# Patient Record
Sex: Female | Born: 1960 | Race: Black or African American | Hispanic: No | Smoking: Never smoker
Health system: Southern US, Community
[De-identification: ages and names within clinical notes are randomized; demographics above are authoritative.]

## PROBLEM LIST (undated history)

## (undated) DIAGNOSIS — M62838 Other muscle spasm: Secondary | ICD-10-CM

## (undated) DIAGNOSIS — I10 Essential (primary) hypertension: Secondary | ICD-10-CM

## (undated) HISTORY — PX: LEEP: SHX91

## (undated) HISTORY — DX: Other muscle spasm: M62.838

---

## 2014-02-05 ENCOUNTER — Encounter (HOSPITAL_COMMUNITY): Payer: Self-pay | Admitting: Emergency Medicine

## 2014-02-05 ENCOUNTER — Emergency Department (HOSPITAL_COMMUNITY): Payer: Medicaid Other

## 2014-02-05 ENCOUNTER — Emergency Department (HOSPITAL_COMMUNITY)
Admission: EM | Admit: 2014-02-05 | Discharge: 2014-02-05 | Disposition: A | Discharge status: Home / Self Care | Payer: Medicaid Other | Attending: Emergency Medicine | Admitting: Emergency Medicine

## 2014-02-05 DIAGNOSIS — R059 Cough, unspecified: Secondary | ICD-10-CM | POA: Insufficient documentation

## 2014-02-05 DIAGNOSIS — R05 Cough: Secondary | ICD-10-CM | POA: Diagnosis present

## 2014-02-05 DIAGNOSIS — J159 Unspecified bacterial pneumonia: Secondary | ICD-10-CM | POA: Insufficient documentation

## 2014-02-05 DIAGNOSIS — I10 Essential (primary) hypertension: Secondary | ICD-10-CM | POA: Insufficient documentation

## 2014-02-05 DIAGNOSIS — J189 Pneumonia, unspecified organism: Secondary | ICD-10-CM

## 2014-02-05 DIAGNOSIS — IMO0001 Reserved for inherently not codable concepts without codable children: Secondary | ICD-10-CM | POA: Insufficient documentation

## 2014-02-05 DIAGNOSIS — Z3202 Encounter for pregnancy test, result negative: Secondary | ICD-10-CM | POA: Diagnosis not present

## 2014-02-05 HISTORY — DX: Essential (primary) hypertension: I10

## 2014-02-05 LAB — CBC WITH DIFFERENTIAL/PLATELET
BASOS ABS: 0 10*3/uL (ref 0.0–0.1)
BASOS PCT: 0 % (ref 0–1)
Eosinophils Absolute: 0.1 10*3/uL (ref 0.0–0.7)
Eosinophils Relative: 1 % (ref 0–5)
HCT: 37.7 % (ref 36.0–46.0)
Hemoglobin: 12.9 g/dL (ref 12.0–15.0)
Lymphocytes Relative: 18 % (ref 12–46)
Lymphs Abs: 2.7 10*3/uL (ref 0.7–4.0)
MCH: 27.6 pg (ref 26.0–34.0)
MCHC: 34.2 g/dL (ref 30.0–36.0)
MCV: 80.6 fL (ref 78.0–100.0)
Monocytes Absolute: 1.5 10*3/uL — ABNORMAL HIGH (ref 0.1–1.0)
Monocytes Relative: 10 % (ref 3–12)
NEUTROS PCT: 71 % (ref 43–77)
Neutro Abs: 10.3 10*3/uL — ABNORMAL HIGH (ref 1.7–7.7)
Platelets: 309 10*3/uL (ref 150–400)
RBC: 4.68 MIL/uL (ref 3.87–5.11)
RDW: 13.7 % (ref 11.5–15.5)
WBC: 14.6 10*3/uL — ABNORMAL HIGH (ref 4.0–10.5)

## 2014-02-05 LAB — URINE MICROSCOPIC-ADD ON

## 2014-02-05 LAB — COMPREHENSIVE METABOLIC PANEL
ALK PHOS: 70 U/L (ref 39–117)
ALT: 17 U/L (ref 0–35)
AST: 23 U/L (ref 0–37)
Albumin: 3.2 g/dL — ABNORMAL LOW (ref 3.5–5.2)
Anion gap: 10 (ref 5–15)
BILIRUBIN TOTAL: 0.3 mg/dL (ref 0.3–1.2)
BUN: 8 mg/dL (ref 6–23)
CO2: 27 mEq/L (ref 19–32)
CREATININE: 1.13 mg/dL — AB (ref 0.50–1.10)
Calcium: 8.9 mg/dL (ref 8.4–10.5)
Chloride: 103 mEq/L (ref 96–112)
GFR calc Af Amer: 65 mL/min — ABNORMAL LOW (ref >90)
GFR calc non Af Amer: 56 mL/min — ABNORMAL LOW (ref >90)
Glucose, Bld: 118 mg/dL — ABNORMAL HIGH (ref 70–99)
POTASSIUM: 3.9 meq/L (ref 3.7–5.3)
Sodium: 140 mEq/L (ref 137–147)
Total Protein: 7.5 g/dL (ref 6.0–8.3)

## 2014-02-05 LAB — URINALYSIS, ROUTINE W REFLEX MICROSCOPIC
Bilirubin Urine: NEGATIVE
Glucose, UA: NEGATIVE mg/dL
Ketones, ur: NEGATIVE mg/dL
Leukocytes, UA: NEGATIVE
NITRITE: NEGATIVE
Protein, ur: NEGATIVE mg/dL
SPECIFIC GRAVITY, URINE: 1.016 (ref 1.005–1.030)
Urobilinogen, UA: 0.2 mg/dL (ref 0.0–1.0)
pH: 7 (ref 5.0–8.0)

## 2014-02-05 LAB — POC URINE PREG, ED: Preg Test, Ur: NEGATIVE

## 2014-02-05 LAB — I-STAT TROPONIN, ED: Troponin i, poc: 0 ng/mL (ref 0.00–0.08)

## 2014-02-05 LAB — TROPONIN I: Troponin I: <0.3 ng/mL (ref <0.30)

## 2014-02-05 LAB — RAPID STREP SCREEN (MED CTR MEBANE ONLY): Streptococcus, Group A Screen (Direct): NEGATIVE

## 2014-02-05 LAB — PRO B NATRIURETIC PEPTIDE: PRO B NATRI PEPTIDE: 42.5 pg/mL (ref 0–125)

## 2014-02-05 LAB — I-STAT CG4 LACTIC ACID, ED: LACTIC ACID, VENOUS: 1.04 mmol/L (ref 0.5–2.2)

## 2014-02-05 MED ORDER — SODIUM CHLORIDE 0.9 % IV BOLUS (SEPSIS)
1000.0000 mL | Freq: Once | INTRAVENOUS | Status: AC
  Administered 2014-02-05: 1000 mL via INTRAVENOUS

## 2014-02-05 MED ORDER — ACETAMINOPHEN 325 MG PO TABS
650.0000 mg | ORAL_TABLET | Freq: Once | ORAL | Status: AC
  Administered 2014-02-05: 650 mg via ORAL
  Filled 2014-02-05: qty 2

## 2014-02-05 MED ORDER — LEVOFLOXACIN 250 MG PO TABS: 750.0000 mg | ORAL_TABLET | Freq: Every day | ORAL | Status: DC

## 2014-02-05 MED ORDER — LEVOFLOXACIN 750 MG PO TABS
750.0000 mg | ORAL_TABLET | Freq: Once | ORAL | Status: AC
  Administered 2014-02-05: 750 mg via ORAL
  Filled 2014-02-05: qty 1

## 2014-02-05 NOTE — ED Notes (Signed)
Lab results given to PA. 

## 2014-02-05 NOTE — ED Notes (Signed)
Pt c/o sore throat with chills and fever onset mOnday, Pt now with productive cough.

## 2014-02-05 NOTE — ED Notes (Signed)
Pt ambulated with pulse ox. Sats did not drop below 96%.

## 2014-02-05 NOTE — Progress Notes (Signed)
  CARE MANAGEMENT ED NOTE 02/05/2014  Patient:  Kaylee Mendoza, Kaylee Mendoza   Account Number:  [id number]  Date Initiated:  02/05/2014  Documentation initiated by:  Livia Snellen  Subjective/Objective Assessment:   Patient presents to [facility] with cough, sore throat and flu like symptoms since Monday.     Subjective/Objective Assessment Detail:   Patient with pmhx of HTN.     Action/Plan:   Action/Plan Detail:   Anticipated DC Date:  02/05/2014     Status Recommendation to Physician:   Result of Recommendation:    Other ED [place]  Other  PCP issues    Choice offered to / List presented to:            Status of service:  Completed, signed off  ED Comments:   ED Comments Detail:  [facility] received phone call from patient regarding medication assistance.  Patient reports she was discharged from [facility] with prescription for Levaquin and is unable to afford it. Patient is without insurance or a pcp.  Patient has just moved here.  EDCM placed patient into the [facility] program. EDCM explained to patient that this is a one time assist and should esatablish care with a pcp.  EDCM advised patient that she will not be eligible for this program until Sept. 2016.  [facility] informed patient that the copay will be three dollars of which patient can afford.  Patient reports she is getting her RX filled at the [facility] on [address].  EDCM called pharmay at [phone] to receive pharmacy fax number of [phone].  [facility] faxed [place] letter to [place] Dezza @ 1734pm with confirmation of receipt at 1736pm.  [facility] also received patient's permission to email [facility] in attempts to make patient an appointment.  [facility] provided patient with phone number to [facility] and encouraged patient to call on Monday between 9am and 915am to schedule ED follow up appointment.  Patient verbalized understanding and thankful for assistance.  EDCM called [place] at 1832pm who confirms  patient's prescription has been filled for three dollars.  No further EDCM needs at this time.

## 2014-02-05 NOTE — ED Provider Notes (Signed)
CSN: [id number]     Arrival date & time 02/05/14  0915 History   First MD Initiated Contact with Patient 02/05/14 1012     Chief Complaint  Patient presents with  . Cough     (Consider location/radiation/quality/duration/timing/severity/associated sxs/prior Treatment) The history is provided by the patient. No language interpreter was used.  Kaylee Mendoza is a 53 y/o F with PMHx of HTN presenting to the ED with cough, sore throat, and flu-like symptoms that have been ongoing since Monday. Patient reported that on Monday she started to experience mild soreness to her throat, cough, and chest soreness associated with the cough. Reported that on Tuesday and Wednesday she started to feel feverish - reported that she has not been checking her temperature. Stated that last night she noticed that her cough has gotten worse - reported that she was unable to lay down flat secondary to continuous coughing. Reported that she has a productive cough of a clear sputum that consists of small specks of red. Stated that she had to get up from her bed and sleep on the couch - this was the only way that she could feel comfortable. Stated that she has been having increased chest soreness associated with coughing - reported that when she coughs she has some labored breathing. Reported generalized bodyaches in the beginning of the week that has now resolved. Stated that since the cough got worse so did her sore throat. Associated symptom of nasal congestion. Stated that she has been taking Alka seltzer plus and used apple cider vinegar last night to help calm down the coughing. Patient reported that her grandchildren has similar symptoms of fever, cough, nasal congestion. Denied nausea, vomiting, diarrhea, melena hematochezia, abdominal pain, neck pain, neck stiffness, chest pain, shortness of breath, travels, leg swelling, ear complain, eye complaints, blurred vision, sudden loss of vision. PCP none  Past Medical History   Diagnosis Date  . Hypertension    History reviewed. No pertinent past surgical history. No family history on file. History  Substance Use Topics  . Smoking status: Never Smoker   . Smokeless tobacco: Not on file  . Alcohol Use: Yes   OB History   Grav Para Term Preterm Abortions TAB SAB Ect Mult Living                 Review of Systems  Constitutional: Positive for fever and chills.  Respiratory: Positive for cough, chest tightness and shortness of breath.   Cardiovascular: Negative for chest pain.  Gastrointestinal: Negative for nausea, vomiting, abdominal pain, diarrhea, constipation, blood in stool and anal bleeding.  Musculoskeletal: Positive for myalgias (generalized). Negative for back pain, neck pain and neck stiffness.  Neurological: Negative for dizziness, weakness and headaches.      Allergies  Review of patient's allergies indicates no known allergies.  Home Medications   Prior to Admission medications   Medication Sig Start Date End Date Taking? Authorizing Provider  levofloxacin (LEVAQUIN) 250 MG tablet Take 3 tablets (750 mg total) by mouth daily. 02/05/14   Zeke Aker, PA-C   BP 139/86  Pulse 96  Temp(Src) 99.1 F (37.3 C) (Oral)  Resp 19  Ht 5\' 6"  (1.676 m)  Wt 215 lb (97.523 kg)  BMI 34.72 kg/m2  SpO2 95% Physical Exam  Nursing note and vitals reviewed. Constitutional: She is oriented to person, place, and time. She appears well-developed and well-nourished. No distress.  HENT:  Head: Normocephalic and atraumatic.  Mouth/Throat: Oropharynx is clear and moist. No oropharyngeal  exudate.  Nasal congestion noted on exam  Mild erythema noted to the posterior oropharynx. Uvula midline with symmetrical elevation. Negative deviation of the uvula noted - negative swelling. Negative exudate. Negative petechiae noted to the soft palate. Negative trismus. Mild tonsillar adenopathy noted bilaterally.  Eyes: Conjunctivae and EOM are normal. Pupils are  equal, round, and reactive to light. Right eye exhibits no discharge. Left eye exhibits no discharge.  Neck: Normal range of motion. Neck supple. No tracheal deviation present.  Negative neck stiffness Negative nuchal rigidity  Negative cervical lymphadenopathy  Negative meningeal signs   Cardiovascular: Normal rate, regular rhythm and normal heart sounds.  Exam reveals no friction rub.   No murmur heard. Cap refill < 3 seconds Negative swelling or pitting edema noted to the lower extremities bilaterally   Pulmonary/Chest: Effort normal and breath sounds normal. No respiratory distress. She has no wheezes. She has no rales. She exhibits tenderness.  Patient is able to speak in full sentences without difficulty  Negative use of accessory muscles Negative stridor  Musculoskeletal: Normal range of motion. She exhibits no edema and no tenderness.  Full ROM to upper and lower extremities without difficulty noted, negative ataxia noted.  Lymphadenopathy:       Head (right side): Tonsillar adenopathy present.       Head (left side): Tonsillar adenopathy present.    She has no cervical adenopathy.  Neurological: She is alert and oriented to person, place, and time. No cranial nerve deficit. She exhibits normal muscle tone. Coordination normal.  Cranial nerves III-XII grossly intact Strength 5+/5+ to upper and lower extremities bilaterally with resistance applied, equal distribution noted Equal grip strength bilaterally  Negative facial droop Negative slurred speech  Negative aphasia Negative arm drift Fine motor skills intact  Skin: Skin is warm and dry. No rash noted. She is not diaphoretic. No erythema.  Psychiatric: She has a normal mood and affect. Her behavior is normal. Thought content normal.    ED Course  Procedures (including critical care time)  Results for orders placed during the hospital encounter of 02/05/14  RAPID STREP SCREEN      Result Value Ref Range   Streptococcus,  Group A Screen (Direct) NEGATIVE  NEGATIVE  CBC WITH DIFFERENTIAL      Result Value Ref Range   WBC 14.6 (*) 4.0 - 10.5 K/uL   RBC 4.68  3.87 - 5.11 MIL/uL   Hemoglobin 12.9  12.0 - 15.0 g/dL   HCT 37.7  36.0 - 46.0 %   MCV 80.6  78.0 - 100.0 fL   MCH 27.6  26.0 - 34.0 pg   MCHC 34.2  30.0 - 36.0 g/dL   RDW 13.7  11.5 - 15.5 %   Platelets 309  150 - 400 K/uL   Neutrophils Relative % 71  43 - 77 %   Neutro Abs 10.3 (*) 1.7 - 7.7 K/uL   Lymphocytes Relative 18  12 - 46 %   Lymphs Abs 2.7  0.7 - 4.0 K/uL   Monocytes Relative 10  3 - 12 %   Monocytes Absolute 1.5 (*) 0.1 - 1.0 K/uL   Eosinophils Relative 1  0 - 5 %   Eosinophils Absolute 0.1  0.0 - 0.7 K/uL   Basophils Relative 0  0 - 1 %   Basophils Absolute 0.0  0.0 - 0.1 K/uL  COMPREHENSIVE METABOLIC PANEL      Result Value Ref Range   Sodium 140  137 - 147 mEq/L  Potassium 3.9  3.7 - 5.3 mEq/L   Chloride 103  96 - 112 mEq/L   CO2 27  19 - 32 mEq/L   Glucose, Bld 118 (*) 70 - 99 mg/dL   BUN 8  6 - 23 mg/dL   Creatinine, Ser 1.13 (*) 0.50 - 1.10 mg/dL   Calcium 8.9  8.4 - 10.5 mg/dL   Total Protein 7.5  6.0 - 8.3 g/dL   Albumin 3.2 (*) 3.5 - 5.2 g/dL   AST 23  0 - 37 U/L   ALT 17  0 - 35 U/L   Alkaline Phosphatase 70  39 - 117 U/L   Total Bilirubin 0.3  0.3 - 1.2 mg/dL   GFR calc non Af Amer 56 (*) >90 mL/min   GFR calc Af Amer 65 (*) >90 mL/min   Anion gap 10  5 - 15  TROPONIN I      Result Value Ref Range   Troponin I <0.30  <0.30 ng/mL  PRO B NATRIURETIC PEPTIDE      Result Value Ref Range   Pro B Natriuretic peptide (BNP) 42.5  0 - 125 pg/mL  URINALYSIS, ROUTINE W REFLEX MICROSCOPIC      Result Value Ref Range   Color, Urine YELLOW  YELLOW   APPearance CLEAR  CLEAR   Specific Gravity, Urine 1.016  1.005 - 1.030   pH 7.0  5.0 - 8.0   Glucose, UA NEGATIVE  NEGATIVE mg/dL   Hgb urine dipstick SMALL (*) NEGATIVE   Bilirubin Urine NEGATIVE  NEGATIVE   Ketones, ur NEGATIVE  NEGATIVE mg/dL   Protein, ur NEGATIVE   NEGATIVE mg/dL   Urobilinogen, UA 0.2  0.0 - 1.0 mg/dL   Nitrite NEGATIVE  NEGATIVE   Leukocytes, UA NEGATIVE  NEGATIVE  URINE MICROSCOPIC-ADD ON      Result Value Ref Range   Squamous Epithelial / LPF RARE  RARE   WBC, UA 0-2  <3 WBC/hpf   RBC / HPF 0-2  <3 RBC/hpf   Bacteria, UA RARE  RARE  I-STAT CG4 LACTIC ACID, ED      Result Value Ref Range   Lactic Acid, Venous 1.04  0.5 - 2.2 mmol/L  POC URINE PREG, ED      Result Value Ref Range   Preg Test, Ur NEGATIVE  NEGATIVE  I-STAT TROPOININ, ED      Result Value Ref Range   Troponin i, poc 0.00  0.00 - 0.08 ng/mL   Comment 3             Labs Review Labs Reviewed  CBC WITH DIFFERENTIAL - Abnormal; Notable for the following:    WBC 14.6 (*)    Neutro Abs 10.3 (*)    Monocytes Absolute 1.5 (*)    All other components within normal limits  COMPREHENSIVE METABOLIC PANEL - Abnormal; Notable for the following:    Glucose, Bld 118 (*)    Creatinine, Ser 1.13 (*)    Albumin 3.2 (*)    GFR calc non Af Amer 56 (*)    GFR calc Af Amer 65 (*)    All other components within normal limits  URINALYSIS, ROUTINE W REFLEX MICROSCOPIC - Abnormal; Notable for the following:    Hgb urine dipstick SMALL (*)    All other components within normal limits  RAPID STREP SCREEN  CULTURE, GROUP A STREP  TROPONIN I  PRO B NATRIURETIC PEPTIDE  URINE MICROSCOPIC-ADD ON  I-STAT CG4 LACTIC ACID, ED  POC URINE  PREG, ED  Randolm Idol, ED    Imaging Review Dg Chest 2 View  02/05/2014   CLINICAL DATA:  Cough and fever  EXAM: CHEST  2 VIEW  COMPARISON:  None.  FINDINGS: Left lower lobe airspace disease may represent atelectasis or pneumonia. Right lung clear. No effusion. Negative for heart failure.  IMPRESSION: Mild left lower lobe atelectasis/ pneumonia.   Electronically Signed   By: Franchot Gallo M.D.   On: 02/05/2014 11:38     EKG Interpretation None      MDM   Final diagnoses:  CAP (community acquired pneumonia)    Medications   acetaminophen (TYLENOL) tablet 650 mg (650 mg Oral Given 02/05/14 1000)  sodium chloride 0.9 % bolus 1,000 mL (0 mLs Intravenous Stopped 02/05/14 1212)  sodium chloride 0.9 % bolus 1,000 mL (0 mLs Intravenous Stopped 02/05/14 1628)  levofloxacin (LEVAQUIN) tablet 750 mg (750 mg Oral Given 02/05/14 1426)   Filed Vitals:   02/05/14 1500 02/05/14 1530 02/05/14 1600 02/05/14 1629  BP: 144/85 127/68 139/86   Pulse: 92 90 96   Temp:    99.1 F (37.3 C)  TempSrc:    Oral  Resp: 22 28 19    Height:      Weight:      SpO2: 96% 97% 95%    EKG noted normal sinus rhythm with a heart rate of 95 bpm, LVH noted. Troponin negative elevation. Second troponin negative elevation. BNP unremarkable. Rapid strep negative. CBC noted elevated white blood cell count of 14.6. CMP noted mildly elevated creatinine of 1.13. Lactic acid negative elevation. Urine pregnancy negative. Urinalysis noted small hemoglobin with negative nitrites or leukocytes identified-negative findings of infection. Chest x-ray noted mild left lower lobe pneumonia. Patient presenting to the ED with pneumonia noted on chest xray. Negative signs of respiratory distress. Patient given IV fluids and hydrated in the ED setting. Patient started on antibiotics for infection - CAP. Patient ambulated well with negative drop in pulse ox - denied shortness of breath or chest pain with ambulation. Based on PORT score patient is a class I and outpatient treatment is reasonable. Patient stable, afebrile. Patient not septic appearing. Patient feels comfortable to go home. Discharged patient. Discharged patient on antibiotics. Discussed with patient to rest and stay hydrated. Referred to [facility]. Discussed with patient to closely monitor fever. Discussed with patient to closely monitor symptoms and if symptoms are to worsen or change to report back to the ED - strict return instructions given.  Patient agreed to plan of care, understood, all  questions answered.   Jamse Mead, PA-C 02/05/14 1711

## 2014-02-05 NOTE — Discharge Instructions (Signed)
Please call your doctor for a followup appointment within 24-48 hours. When you talk to your doctor please let them know that you were seen in the emergency department and have them acquire all of your records so that they can discuss the findings with you and formulate a treatment plan to fully care for your new and ongoing problems. Please call and set-up an appointment with health and wellness center to be re-assessed Please rest and stay hydrated  Please get creatinine re-checked within 48 hours Please take antibiotics as prescribed and on a full stomach  Please continue to monitor symptoms closely and if symptoms are to worsen or change (fever greater than 101, chills, sweating, nausea, vomiting, chest pain, shortness of breathe, difficulty breathing, weakness, numbness, tingling, worsening or changes to pain pattern, worsening or change to cough, fainting, neck pain, neck stiffness) please report back to the Emergency Department immediately.   Pneumonia Pneumonia is an infection of the lungs.  CAUSES Pneumonia may be caused by bacteria or a virus. Usually, these infections are caused by breathing infectious particles into the lungs (respiratory tract). SIGNS AND SYMPTOMS   Cough.  Fever.  Chest pain.  Increased rate of breathing.  Wheezing.  Mucus production. DIAGNOSIS  If you have the common symptoms of pneumonia, your health care provider will typically confirm the diagnosis with a chest X-ray. The X-ray will show an abnormality in the lung (pulmonary infiltrate) if you have pneumonia. Other tests of your blood, urine, or sputum may be done to find the specific cause of your pneumonia. Your health care provider may also do tests (blood gases or pulse oximetry) to see how well your lungs are working. TREATMENT  Some forms of pneumonia may be spread to other people when you cough or sneeze. You may be asked to wear a mask before and during your exam. Pneumonia that is caused by  bacteria is treated with antibiotic medicine. Pneumonia that is caused by the influenza virus may be treated with an antiviral medicine. Most other viral infections must run their course. These infections will not respond to antibiotics.  HOME CARE INSTRUCTIONS   Cough suppressants may be used if you are losing too much rest. However, coughing protects you by clearing your lungs. You should avoid using cough suppressants if you can.  Your health care provider may have prescribed medicine if he or she thinks your pneumonia is caused by bacteria or influenza. Finish your medicine even if you start to feel better.  Your health care provider may also prescribe an expectorant. This loosens the mucus to be coughed up.  Take medicines only as directed by your health care provider.  Do not smoke. Smoking is a common cause of bronchitis and can contribute to pneumonia. If you are a smoker and continue to smoke, your cough may last several weeks after your pneumonia has cleared.  A cold steam vaporizer or humidifier in your room or home may help loosen mucus.  Coughing is often worse at night. Sleeping in a semi-upright position in a recliner or using a couple pillows under your head will help with this.  Get rest as you feel it is needed. Your body will usually let you know when you need to rest. PREVENTION A pneumococcal shot (vaccine) is available to prevent a common bacterial cause of pneumonia. This is usually suggested for:  People over 40 years old.  Patients on chemotherapy.  People with chronic lung problems, such as bronchitis or emphysema.  People with  immune system problems. If you are over 65 or have a high risk condition, you may receive the pneumococcal vaccine if you have not received it before. In some countries, a routine influenza vaccine is also recommended. This vaccine can help prevent some cases of pneumonia.You may be offered the influenza vaccine as part of your care. If  you smoke, it is time to quit. You may receive instructions on how to stop smoking. Your health care provider can provide medicines and counseling to help you quit. SEEK MEDICAL CARE IF: You have a fever. SEEK IMMEDIATE MEDICAL CARE IF:   Your illness becomes worse. This is especially true if you are elderly or weakened from any other disease.  You cannot control your cough with suppressants and are losing sleep.  You begin coughing up blood.  You develop pain which is getting worse or is uncontrolled with medicines.  Any of the symptoms which initially brought you in for treatment are getting worse rather than better.  You develop shortness of breath or chest pain. MAKE SURE YOU:   Understand these instructions.  Will watch your condition.  Will get help right away if you are not doing well or get worse. Document Released: 05/07/2005 Document Revised: 09/21/2013 Document Reviewed: 07/27/2010 [facility] Patient Information 2015 [place], [place]. This information is not intended to replace advice given to you by your health care provider. Make sure you discuss any questions you have with your health care provider.   Emergency Department Resource Guide 1) Find a Doctor and Pay Out of Pocket Although you won't have to find out who is covered by your insurance plan, it is a good idea to ask around and get recommendations. You will then need to call the office and see if the doctor you have chosen will accept you as a new patient and what types of options they offer for patients who are self-pay. Some doctors offer discounts or will set up payment plans for their patients who do not have insurance, but you will need to ask so you aren't surprised when you get to your appointment.  2) Contact Your Local Health Department Not all health departments have doctors that can see patients for sick visits, but many do, so it is worth a call to see if yours does. If you don't know where your local  health department is, you can check in your phone book. The CDC also has a tool to help you locate your state's health department, and many state websites also have listings of all of their local health departments.  3) Find a [facility] If your illness is not likely to be very severe or complicated, you may want to try a walk in clinic. These are popping up all over the country in pharmacies, drugstores, and shopping centers. They're usually staffed by nurse practitioners or physician assistants that have been trained to treat common illnesses and complaints. They're usually fairly quick and inexpensive. However, if you have serious medical issues or chronic medical problems, these are probably not your best option.  No Primary Care Doctor: - Call Health Connect at  [phone] - they can help you locate a primary care doctor that  accepts your insurance, provides certain services, etc. - Physician Referral Service- [phone]  Chronic Pain Problems: Organization         Address  Phone   Notes  [facility]  [phone] Patients need to be referred by their primary care doctor.  Medication Assistance: Organization         Address  Phone   Notes  [facility] Medication [facility] [address] [place], [place] [address] [phone] --Must be a resident of [facility] -- Must have NO insurance coverage whatsoever (no Medicaid/ Medicare, etc.) -- The pt. MUST have a primary care doctor that directs their care regularly and follows them in the community   [facility]ist  [phone]   [employer]  [phone]    Agencies that provide inexpensive medical care: Organization         Address  Phone   Notes  [place]  [phone]   [facility]    [phone]   [facility] [place], [place] [address] [phone]   [place] [address], [place] [phone]   Planned Parenthood    [phone]   [facility]    [phone]   [place] and [place] [address], [place] Phone:  [phone], Fax:  [phone] Hours of Operation:  9 am - 6 pm, M-F.  Also accepts Medicaid/Medicare and self-pay.  [facility] for [place], [address], [place] Phone: [phone], Fax: [phone]. Hours of Operation:  8:30 am - 5:30 pm, M-F.  Also accepts Medicaid and self-pay.  [facility] [address] [place] Phone: [phone]   [place], [place], [place] [phone], Ext. [phone] Mondays & Thursdays: 7-9 AM.  First 15 patients are seen on a first come, first serve basis.    [place] Providers:  Organization         Address  Phone   Notes  [facility] [address], [place] [phone] Also accepts self-pay patients.  [facility] [address], [place]  [phone]   [place], [address], [place] [phone]   [facility] [address], [place] [phone]   Lucianne Lei [address], [place]   [phone] Only accepts [place] Access [place] patients after they have their name applied to their card.   Self-Pay (no insurance) in [facility]:  Organization         Address  Phone   Notes  Sickle Cell Patients, [facility] [place] [phone]   [facility] [place] [phone]   [facility] [place]  [place], [place], [place] [phone]   [facility]/Dr. Osei-Bonsu  [address], [place] or [address], [place] [phone] Phone number for both [place] and  [place] locations is the same.  Urgent Medical and [facility] [address], [place] [phone]   [facility] [address] [place] or [address] [phone]   [facility] [address] [place] [phone], phone; [phone], fax Sees patients 1st and 3rd Saturday of every month.  Must not qualify for public or private insurance (i.e. Medicaid, Medicare, [place] Health Choice, Veterans' Benefits)  Household income should be no more than 200% of the poverty level The  clinic cannot treat you if you are pregnant or think you are pregnant  Sexually transmitted diseases are not treated at the clinic.    Dental Care: Organization         Address  Phone  Notes  [facility] Department of [facility] [phone] Accepts children up to age 41 who are enrolled in [place] or [place]; pregnant women with a Medicaid card; and children who have applied for Medicaid or [place] Health Choice, but were declined, whose parents can pay a reduced fee at time of service.  [facility] Department of [facility]  [address], [place] [phone] Accepts children up to age 37 who are enrolled in [place] or [place]; pregnant women with a Medicaid card; and children who have applied for Medicaid or  Health Choice, but were declined, whose parents can pay a reduced fee at time of service.  [place] Adult Dental Access PROGRAM  [place] [phone] Patients are seen by appointment only. Walk-ins are not accepted. [place] will see patients 32 years of age and older. Monday - Tuesday (8am-5pm) Most Wednesdays (8:30-5pm) $30 per visit, cash only  [facility] Adult Dental Access PROGRAM  [address], [facility] [phone] Patients are seen by appointment only. Walk-ins are not accepted.  [place] will see patients 17 years of age and older. One Wednesday Evening (Monthly: Volunteer Based).  $30 per visit, cash only  [place]  [phone] for adults; Children under age 30, call Graduate Pediatric Dentistry at [phone]. Children aged 49-14, please call [phone] to request a pediatric application.  Dental services are provided in all areas of dental care including fillings, crowns and bridges, complete and partial dentures, implants, gum treatment, root canals, and extractions. Preventive care is also provided. Treatment is provided to both adults and children. Patients are selected via a lottery and there is often a waiting list.   [facility] [address] [place]  [phone] [email or web address]   [facility] [address] [place], [place] [phone], Ext. [phone] Second and Fourth Thursday of each month, opens at 6:30 AM; Clinic ends at 9 AM.  Patients are seen on a first-come first-served basis, and a limited number are seen during each clinic.   [facility]  [address] Hillard Danker [place], [place] [phone]   Eligibility Requirements You must have lived in [place], [place], or [place] counties for at least the last three months.   You cannot be eligible for state or federal sponsored [employer], including [employer], [place], or [employer].   You generally cannot be eligible for healthcare insurance through your employer.    How to apply: Eligibility screenings are held every Tuesday and Wednesday afternoon from 1:00 pm until 4:00 pm. You do not need an appointment for the interview!  [facility] [address], [place], [place]   [place]  [place] Department  [place]  [phone]    Behavioral Health Resources in the  Community: Intensive Outpatient Programs Organization         Address  Phone  Notes  [place]. [address] [place], [place] [phone]   [facility] [address] [place] [employer],  [place] [phone]   ADS: [facility] Drug Svcs [address] [place], [place]   [place] [address],  [place], [place] or [phone]   Substance Abuse Resources Organization         Address  Phone  Notes  Alcohol and Drug Services  3015454660   [place]  [phone]   [facility] [place]   Chinita Pester  9406975812   Residential & Outpatient Substance Abuse Program  [phone]   Psychological Services Organization         Address  Phone  Notes  [facility] [place]  [place]  [phone]   [place] [address] [place] or [phone]    Mobile Crisis Teams Organization         Address  Phone  Notes  [facility], Mobile Crisis Care Unit  [phone]   [facility]  [address] [place], [place]   [place] [address], [place] [phone]    Self-Help/Support Groups Organization         Address  Phone             Notes  [place]. of [place] - variety of support groups  [place] Call for more information  Narcotics Anonymous (NA), [facility] [address], [employer] [place]  2 meetings at this location   Special educational needs teacher         Address  Phone  Notes  [facility] Residential Treatment [place],    [place]  [phone]   [facility]  [address] [place] [address], [place], [place]   [place], [place] [phone] Admissions: 8am-3pm M-F  Incentives Substance [place] [address],    [place], [place] [phone]   [facility] [address] [place], [place], [place]   [facility] [address],  [place], [place]   [facility] - Intensive Outpatient [address], [place], [place]   [facility].,  [place], [place] [phone] or [phone]   [facility] Treatment Services (RTS) [address], [place], [place] Accepts Medicaid  [facility] [place] [address]  [place] [phone] Substance Abuse/Addiction Treatment   [facility] Organization         Address  Phone  Notes  [facility]  [phone]   Domenic Schwab, PhD [address] Arlis Porta [place], [place]   [phone] or [phone]   [place], [place] [phone]   [facility] [address] [place], [place] [phone] Insurance/Medicaid/sponsorship through [facility] [address], [address] [place]                                    [place], [place] [phone] [place] [address], [place] [phone]    Dr. Adele Schilder  [phone]   [facility] Dept. 1) [address], [place] 2) [place] 3)  [address], [place] [phone]  (  [place] [phone] or [phone] (After Hours)

## 2014-02-06 NOTE — ED Provider Notes (Signed)
Medical screening examination/treatment/procedure(s) were performed by non-physician practitioner and as supervising physician I was immediately available for consultation/collaboration.   EKG Interpretation None        Carmin Muskrat, MD 02/06/14 [phone]

## 2014-02-07 LAB — CULTURE, GROUP A STREP

## 2015-05-12 ENCOUNTER — Other Ambulatory Visit: Payer: Self-pay

## 2015-05-12 DIAGNOSIS — Z1231 Encounter for screening mammogram for malignant neoplasm of breast: Secondary | ICD-10-CM

## 2015-05-30 ENCOUNTER — Ambulatory Visit: Payer: Medicaid Other

## 2015-08-08 ENCOUNTER — Ambulatory Visit
Admission: RE | Admit: 2015-08-08 | Discharge: 2015-08-08 | Disposition: A | Discharge status: Home / Self Care | Payer: Medicaid Other | Source: Ambulatory Visit

## 2015-08-08 DIAGNOSIS — Z1231 Encounter for screening mammogram for malignant neoplasm of breast: Secondary | ICD-10-CM

## 2015-08-18 ENCOUNTER — Other Ambulatory Visit: Payer: Self-pay | Admitting: Nurse Practitioner

## 2015-08-18 DIAGNOSIS — R928 Other abnormal and inconclusive findings on diagnostic imaging of breast: Secondary | ICD-10-CM

## 2015-08-29 ENCOUNTER — Ambulatory Visit
Admission: RE | Admit: 2015-08-29 | Discharge: 2015-08-29 | Disposition: A | Discharge status: Home / Self Care | Payer: Medicaid Other | Source: Ambulatory Visit | Attending: *Deleted | Admitting: *Deleted

## 2015-08-29 DIAGNOSIS — R928 Other abnormal and inconclusive findings on diagnostic imaging of breast: Secondary | ICD-10-CM

## 2016-09-26 ENCOUNTER — Other Ambulatory Visit: Payer: Self-pay | Admitting: Nurse Practitioner

## 2016-09-26 DIAGNOSIS — Z1231 Encounter for screening mammogram for malignant neoplasm of breast: Secondary | ICD-10-CM

## 2016-10-10 ENCOUNTER — Ambulatory Visit
Admission: RE | Admit: 2016-10-10 | Discharge: 2016-10-10 | Disposition: A | Discharge status: Home / Self Care | Payer: Medicaid Other | Source: Ambulatory Visit | Attending: Nurse Practitioner | Admitting: Nurse Practitioner

## 2016-10-10 DIAGNOSIS — Z1231 Encounter for screening mammogram for malignant neoplasm of breast: Secondary | ICD-10-CM

## 2018-02-11 ENCOUNTER — Emergency Department (HOSPITAL_COMMUNITY)
Admission: EM | Admit: 2018-02-11 | Discharge: 2018-02-12 | Disposition: A | Discharge status: Home / Self Care | Payer: Self-pay | Attending: Emergency Medicine | Admitting: Emergency Medicine

## 2018-02-11 ENCOUNTER — Other Ambulatory Visit: Payer: Self-pay

## 2018-02-11 ENCOUNTER — Encounter (HOSPITAL_COMMUNITY): Payer: Self-pay | Admitting: Emergency Medicine

## 2018-02-11 DIAGNOSIS — Y939 Activity, unspecified: Secondary | ICD-10-CM | POA: Insufficient documentation

## 2018-02-11 DIAGNOSIS — I10 Essential (primary) hypertension: Secondary | ICD-10-CM | POA: Insufficient documentation

## 2018-02-11 DIAGNOSIS — S29012A Strain of muscle and tendon of back wall of thorax, initial encounter: Secondary | ICD-10-CM | POA: Insufficient documentation

## 2018-02-11 DIAGNOSIS — Y33XXXA Other specified events, undetermined intent, initial encounter: Secondary | ICD-10-CM | POA: Insufficient documentation

## 2018-02-11 DIAGNOSIS — Y998 Other external cause status: Secondary | ICD-10-CM | POA: Insufficient documentation

## 2018-02-11 DIAGNOSIS — T148XXA Other injury of unspecified body region, initial encounter: Secondary | ICD-10-CM

## 2018-02-11 DIAGNOSIS — Y929 Unspecified place or not applicable: Secondary | ICD-10-CM | POA: Insufficient documentation

## 2018-02-11 DIAGNOSIS — S299XXA Unspecified injury of thorax, initial encounter: Secondary | ICD-10-CM | POA: Insufficient documentation

## 2018-02-11 NOTE — ED Triage Notes (Addendum)
Pt c/o chronic back pain and hypertension. Hx of HTN, not taking medications. Denies chest pain/shortness of breath/headache.

## 2018-02-12 NOTE — ED Provider Notes (Signed)
[facility] EMERGENCY DEPARTMENT Provider Note  CSN: [id number] Arrival date & time: 02/11/18 2120  Chief Complaint(s) Hypertension  HPI Kaylee Mendoza is a 57 y.o. female with a history of chronic back pain resulting in elevated blood pressures when pain is severe not currently on any BP medication who presents to the emergency department with elevated systolic blood pressures in the 200s in the setting of exacerbation of her back pain.  Patient denied any associated chest pain or shortness of breath but they report palpitations and "thumping" of her heart which were the reason she presented.  Her palpitations and thumping has resolved.  Patient has not taken any medicine for blood pressure.  She denies any recent fevers or infections.  Her back pain is achy/crampy nature and exacerbated with palpation and movement.  She denies any coughing.  No nausea or vomiting.  No abdominal pain.  No focal deficits.  HPI  Past Medical History Past Medical History:  Diagnosis Date  . Hypertension    There are no active problems to display for this patient.  Home Medication(s) Prior to Admission medications   Medication Sig Start Date End Date Taking? Authorizing Provider  levofloxacin (LEVAQUIN) 250 MG tablet Take 3 tablets (750 mg total) by mouth daily. Patient not taking: Reported on 02/12/2018 02/05/14   Jamse Mead, PA-C                                                                                                                                    Past Surgical History History reviewed. No pertinent surgical history. Family History No family history on file.  Social History Social History   Tobacco Use  . Smoking status: Never Smoker  . Smokeless tobacco: Never Used  Substance Use Topics  . Alcohol use: Yes  . Drug use: No   Allergies Patient has no known allergies.  Review of Systems Review of Systems All other systems are reviewed and are negative for  acute change except as noted in the HPI  Physical Exam Vital Signs  I have reviewed the triage vital signs BP (!) 141/68   Pulse (!) 57   Temp 97.6 F (36.4 C)   Resp 16   SpO2 93%   Physical Exam  Constitutional: She is oriented to person, place, and time. She appears well-developed and well-nourished. No distress.  HENT:  Head: Normocephalic and atraumatic.  Nose: Nose normal.  Eyes: Pupils are equal, round, and reactive to light. Conjunctivae and EOM are normal. Right eye exhibits no discharge. Left eye exhibits no discharge. No scleral icterus.  Neck: Normal range of motion. Neck supple.  Cardiovascular: Normal rate and regular rhythm. Exam reveals no gallop and no friction rub.  No murmur heard. Pulmonary/Chest: Effort normal and breath sounds normal. No stridor. No respiratory distress. She has no rales.  Abdominal: Soft. She exhibits no distension. There is no tenderness.  Musculoskeletal: She exhibits no  edema.       Cervical back: She exhibits tenderness and spasm.       Back:  Neurological: She is alert and oriented to person, place, and time.  Skin: Skin is warm and dry. No rash noted. She is not diaphoretic. No erythema.  Psychiatric: She has a normal mood and affect.  Vitals reviewed.   ED Results and Treatments Labs (all labs ordered are listed, but only abnormal results are displayed) Labs Reviewed - No data to display                                                                                                                       EKG  EKG Interpretation  Date/Time:  Wednesday February 12 2018 02:13:36 EDT Ventricular Rate:  74 PR Interval:    QRS Duration: 117 QT Interval:  411 QTC Calculation: 456 R Axis:   22 Text Interpretation:  Sinus rhythm Incomplete left bundle branch block No significant change since last tracing Confirmed by Addison Lank [phone]) on 02/12/2018 2:24:22 AM      Radiology No results found. Pertinent labs & imaging  results that were available during my care of the patient were reviewed by me and considered in my medical decision making (see chart for details).  Medications Ordered in ED Medications - No data to display                                                                                                                                  Procedures Procedures  (including critical care time)  Medical Decision Making / ED Course I have reviewed the nursing notes for this encounter and the patient's prior records (if available in EHR or on provided paperwork).    Elevated blood pressures in the setting of back pain.  Pressures have now resolved spontaneously.  EKG without acute ischemic changes.  Doubt ACS.  Not consistent with pulmonary embolism.  Not classic for your dissection or esophageal perforation.  Otherwise asymptomatic hypertension which is now resolved.  The patient appears reasonably screened and/or stabilized for discharge and I doubt any other medical condition or other [facility] requiring further screening, evaluation, or treatment in the ED at this time prior to discharge.  The patient is safe for discharge with strict return precautions.   Final Clinical Impression(s) / ED Diagnoses Final diagnoses:  Hypertension, unspecified type  Muscle strain    Disposition: Discharge  Condition: Good  I have discussed the results, Dx and Tx plan with the patient who expressed understanding and agree(s) with the plan. Discharge instructions discussed at great length. The patient was given strict return precautions who verbalized understanding of the instructions. No further questions at time of discharge.    ED Discharge Orders    None       Follow Up: [place], [address] [place] [address] [phone]  Schedule an appointment as soon as possible for a visit  As needed    This chart was dictated using voice recognition software.  Despite best  efforts to proofread,  errors can occur which can change the documentation meaning.   Fatima Blank, MD 02/12/18 [phone]

## 2018-02-12 NOTE — ED Notes (Signed)
ED Provider at bedside. 

## 2018-02-12 NOTE — Discharge Instructions (Addendum)
You may use over-the-counter Motrin (Ibuprofen), Acetaminophen (Tylenol), topical muscle creams such as SalonPas, Icy Hot, Bengay, etc. Please stretch, apply heat, and have massage therapy for additional assistance. ° °

## 2018-02-12 NOTE — ED Notes (Signed)
Patient Alert and oriented to baseline. Stable and ambulatory to baseline. Patient verbalized understanding of the discharge instructions.  Patient belongings were taken by the patient.   

## 2018-06-30 ENCOUNTER — Encounter: Payer: Self-pay | Admitting: Nurse Practitioner

## 2018-06-30 ENCOUNTER — Ambulatory Visit: Payer: BC Managed Care – PPO | Admitting: Nurse Practitioner

## 2018-06-30 VITALS — BP 150/100 | HR 77 | Temp 97.7°F | Ht 66.25 in | Wt 215.6 lb

## 2018-06-30 DIAGNOSIS — Z87891 Personal history of nicotine dependence: Secondary | ICD-10-CM

## 2018-06-30 DIAGNOSIS — R0602 Shortness of breath: Secondary | ICD-10-CM

## 2018-06-30 DIAGNOSIS — G8929 Other chronic pain: Secondary | ICD-10-CM | POA: Diagnosis not present

## 2018-06-30 DIAGNOSIS — R030 Elevated blood-pressure reading, without diagnosis of hypertension: Secondary | ICD-10-CM | POA: Diagnosis not present

## 2018-06-30 DIAGNOSIS — R7303 Prediabetes: Secondary | ICD-10-CM

## 2018-06-30 DIAGNOSIS — Y9269 Other specified industrial and construction area as the place of occurrence of the external cause: Secondary | ICD-10-CM

## 2018-06-30 DIAGNOSIS — M545 Low back pain, unspecified: Secondary | ICD-10-CM

## 2018-06-30 DIAGNOSIS — Z832 Family history of diseases of the blood and blood-forming organs and certain disorders involving the immune mechanism: Secondary | ICD-10-CM

## 2018-06-30 DIAGNOSIS — M62838 Other muscle spasm: Secondary | ICD-10-CM | POA: Diagnosis not present

## 2018-06-30 DIAGNOSIS — W1809XA Striking against other object with subsequent fall, initial encounter: Secondary | ICD-10-CM

## 2018-06-30 MED ORDER — CYCLOBENZAPRINE HCL 10 MG PO TABS
10.0000 mg | ORAL_TABLET | Freq: Three times a day (TID) | ORAL | 0 refills | Status: DC | PRN
Start: 2018-06-30 — End: 2018-10-01

## 2018-06-30 NOTE — Progress Notes (Addendum)
Subjective:     Patient ID: Kaylee Mendoza , female    DOB: Nov 26, 1960 , 58 y.o.   MRN: [id number]   Chief Complaint  Patient presents with  . Abnormal Glucose  . Back Pain    HPI  She had a fall last week at work - planning to go to [employer].    Back Pain  This is a chronic problem. The current episode started more than 1 year ago. The pain is present in the lumbar spine and thoracic spine. The quality of the pain is described as aching and burning. The pain does not radiate. Associated symptoms include leg pain. Pertinent negatives include no chest pain, fever, numbness or tingling. She has tried nothing for the symptoms.  Leg Pain   The incident occurred more than 1 week ago (after having fall at work - [employer].  Tripped over a stand that was in the door.  ). The injury mechanism was a fall. The pain has been intermittent since onset. Pertinent negatives include no inability to bear weight, numbness or tingling.  Shortness of Breath  This is a chronic problem. The current episode started more than 1 year ago. The problem occurs intermittently. Associated symptoms include leg pain. Pertinent negatives include no chest pain, fever or sore throat. The patient has no known risk factors for DVT/PE. She has tried nothing for the symptoms. There is no history of allergies, CAD, COPD or pneumonia. (Quit smoking 17 years ago.)     Past Medical History:  Diagnosis Date  . Hypertension      No family history on file.  No current outpatient medications on file.   No Known Allergies   Review of Systems  Constitutional: Negative for fever.  HENT: Negative for sore throat.   Respiratory: Positive for shortness of breath. Negative for cough.   Cardiovascular: Negative for chest pain.  Musculoskeletal: Positive for back pain.  Neurological: Negative for tingling and numbness.     Today's Vitals   06/30/18 1616  BP: (!) 150/100  Pulse: 77  Temp: 97.7 F (36.5 C)   TempSrc: Oral  SpO2: 96%  Weight: 215 lb 9.6 oz (97.8 kg)  Height: 5' 6.25" (1.683 m)  PainSc: 0-No pain   Body mass index is 34.54 kg/m.   Objective:  Physical Exam Constitutional:      Appearance: Normal appearance. She is obese.  Cardiovascular:     Rate and Rhythm: Normal rate and regular rhythm.     Pulses: Normal pulses.     Heart sounds: Normal heart sounds. No murmur.  Pulmonary:     Effort: Pulmonary effort is normal. No respiratory distress.     Breath sounds: Normal breath sounds. No wheezing, rhonchi or rales.  Chest:     Chest wall: No tenderness.  Musculoskeletal:     Right lower leg: No edema.     Left lower leg: No edema.     Comments: Negative straight leg raise bilaterally  Mild tenderness to low back flank area primarily on right side  Skin:    General: Skin is dry.     Capillary Refill: Capillary refill takes less than 2 seconds.  Neurological:     General: No focal deficit present.     Mental Status: She is alert.         Assessment And Plan:     1. Chronic bilateral low back pain without sciatica  Negative straight leg raise  Will treat with toradol and kenalog  injection - ketorolac (TORADOL) 30 MG/ML injection 30 mg - triamcinolone acetonide (KENALOG-40) injection 60 mg  2. Prediabetes  Chronic, controlled  No current medications  Encouraged to limit intake of sugary foods and drinks  Encouraged to increase physical activity to 150 minutes per week - Hemoglobin A1c  3. Family history of alpha thalassemia  Reports her daughter was recently diagnosed and she is concerned about if she has the same problem  Discussed with her would check baseline labs for autoimmune however this is a blood dyscrasia which may need further treatment by a hematologist - Autoimmune Profile - CRP (C-Reactive Protein)  4. Blood pressure elevated without history of HTN . B/P is elevated today, she is not taking any medications and feels this is  related to her pain.  . CMP ordered to check renal function.  . The importance of regular exercise and dietary modification was stressed to the patient.  . Stressed importance of losing ten percent of her body weight to help with B/P control.  . The weight loss would help with decreasing cardiac and cancer risk as well.  - CBC no Diff - BMP8+eGFR  5. Muscle spasm  Tension noted to low back area  I have stressed the importance of her stretching regularly - cyclobenzaprine (FLEXERIL) 10 MG tablet; Take 1 tablet (10 mg total) by mouth 3 (three) times daily as needed for muscle spasms.  Dispense: 30 tablet; Refill: 0 - ketorolac (TORADOL) 30 MG/ML injection 30 mg - triamcinolone acetonide (KENALOG-40) injection 60 mg    6. Shortness of breath  No abnormal findings on physical exam.    No current shortness of breath this is intermittent  May be related to anxiety vs deconditioning.  Janece Moore, FNP  

## 2018-07-01 LAB — BMP8+EGFR
BUN / CREAT RATIO: 12 (ref 9–23)
BUN: 14 mg/dL (ref 6–24)
CHLORIDE: 103 mmol/L (ref 96–106)
CO2: 24 mmol/L (ref 20–29)
CREATININE: 1.13 mg/dL — AB (ref 0.57–1.00)
Calcium: 9.9 mg/dL (ref 8.7–10.2)
GFR calc Af Amer: 63 mL/min/{1.73_m2} (ref >59)
GFR calc non Af Amer: 55 mL/min/{1.73_m2} — ABNORMAL LOW (ref >59)
GLUCOSE: 104 mg/dL — AB (ref 65–99)
Potassium: 4.3 mmol/L (ref 3.5–5.2)
SODIUM: 141 mmol/L (ref 134–144)

## 2018-07-01 LAB — HEMOGLOBIN A1C
ESTIMATED AVERAGE GLUCOSE: 134 mg/dL
HEMOGLOBIN A1C: 6.3 % — AB (ref 4.8–5.6)

## 2018-07-01 LAB — CBC
HEMOGLOBIN: 13 g/dL (ref 11.1–15.9)
Hematocrit: 39.5 % (ref 34.0–46.6)
MCH: 26.9 pg (ref 26.6–33.0)
MCHC: 32.9 g/dL (ref 31.5–35.7)
MCV: 82 fL (ref 79–97)
PLATELETS: 363 10*3/uL (ref 150–450)
RBC: 4.84 x10E6/uL (ref 3.77–5.28)
RDW: 13.7 % (ref 11.7–15.4)
WBC: 10.1 10*3/uL (ref 3.4–10.8)

## 2018-07-01 LAB — C-REACTIVE PROTEIN: CRP: 3 mg/L (ref 0–10)

## 2018-07-01 LAB — AUTOIMMUNE PROFILE
Anti Nuclear Antibody(ANA): NEGATIVE
COMPLEMENT C3, SERUM: 137 mg/dL (ref 82–167)

## 2018-07-02 DIAGNOSIS — M545 Low back pain: Secondary | ICD-10-CM | POA: Diagnosis not present

## 2018-07-02 DIAGNOSIS — M62838 Other muscle spasm: Secondary | ICD-10-CM | POA: Diagnosis not present

## 2018-07-02 DIAGNOSIS — G8929 Other chronic pain: Secondary | ICD-10-CM | POA: Diagnosis not present

## 2018-07-02 MED ORDER — KETOROLAC TROMETHAMINE 30 MG/ML IJ SOLN
30.0000 mg | Freq: Once | INTRAMUSCULAR | Status: AC
  Administered 2018-07-02: 30 mg via INTRAMUSCULAR

## 2018-07-02 MED ORDER — TRIAMCINOLONE ACETONIDE 40 MG/ML IJ SUSP
60.0000 mg | Freq: Once | INTRAMUSCULAR | Status: AC
  Administered 2018-07-02: 60 mg via INTRAMUSCULAR

## 2018-07-07 ENCOUNTER — Ambulatory Visit: Payer: BC Managed Care – PPO | Admitting: Nurse Practitioner

## 2018-07-07 ENCOUNTER — Other Ambulatory Visit: Payer: Self-pay

## 2018-07-07 ENCOUNTER — Encounter: Payer: Self-pay | Admitting: Nurse Practitioner

## 2018-07-07 VITALS — BP 136/78 | HR 73 | Temp 97.3°F | Ht 66.2 in | Wt 215.6 lb

## 2018-07-07 DIAGNOSIS — R7303 Prediabetes: Secondary | ICD-10-CM

## 2018-07-07 DIAGNOSIS — E6609 Other obesity due to excess calories: Secondary | ICD-10-CM

## 2018-07-07 DIAGNOSIS — R030 Elevated blood-pressure reading, without diagnosis of hypertension: Secondary | ICD-10-CM

## 2018-07-07 DIAGNOSIS — M62838 Other muscle spasm: Secondary | ICD-10-CM

## 2018-07-07 DIAGNOSIS — Z6834 Body mass index (BMI) 34.0-34.9, adult: Secondary | ICD-10-CM

## 2018-07-07 NOTE — Progress Notes (Signed)
  Subjective:     Patient ID: Kaylee Mendoza , female    DOB: [birth year] , 58 y.o.   MRN: [id number]   Chief Complaint  Patient presents with  . Hypertension    patient presents today for a blood pressure recheck.    HPI  HPI   Past Medical History:  Diagnosis Date  . Hypertension      Family History  Problem Relation Age of Onset  . Diabetes Mother   . Heart disease Mother   . Diabetes Father      Current Outpatient Medications:  .  cyclobenzaprine (FLEXERIL) 10 MG tablet, Take 1 tablet (10 mg total) by mouth 3 (three) times daily as needed for muscle spasms., Disp: 30 tablet, Rfl: 0   No Known Allergies   Review of Systems  Constitutional: Negative.   Respiratory: Negative for shortness of breath.   Cardiovascular: Negative.  Negative for chest pain, palpitations and leg swelling.  Endocrine: Negative for polydipsia, polyphagia and polyuria.  Musculoskeletal: Positive for back pain. Negative for myalgias.  Skin: Negative.   Neurological: Negative for dizziness and headaches.     Today's Vitals   07/07/18 1625  BP: 136/78  Pulse: 73  Temp: (!) 97.3 F (36.3 C)  TempSrc: Oral  SpO2: 96%  Weight: 215 lb 9.6 oz (97.8 kg)  Height: 5' 6.2" (1.681 m)   Body mass index is 34.59 kg/m.   Objective:  Physical Exam Constitutional:      Appearance: Normal appearance.  Cardiovascular:     Rate and Rhythm: Normal rate and regular rhythm.     Pulses: Normal pulses.     Heart sounds: Normal heart sounds. No murmur.  Pulmonary:     Effort: No respiratory distress.     Breath sounds: Normal breath sounds.  Neurological:     General: No focal deficit present.     Mental Status: She is alert and oriented to person, place, and time.  Psychiatric:        Mood and Affect: Mood normal.        Behavior: Behavior normal.        Thought Content: Thought content normal.        Judgment: Judgment normal.         Assessment And Plan:    1. Blood pressure elevated  without history of HTN . B/P is better controlled.  . The importance of regular exercise and dietary modification was stressed to the patient.  . Stressed importance of losing ten percent of her body weight to help with B/P control.  . The weight loss would help with decreasing cardiac and cancer risk as well.   2. Prediabetes  Chronic, fair control  Continue with current medications  Encouraged to limit intake of sugary foods and drinks  Encouraged to increase physical activity to 150 minutes per week  3. Muscle spasm  She reports her back pain is better  4. Class 1 obesity due to excess calories without serious comorbidity with body mass index (BMI) of 34.0 to 34.9 in adult  Chronic  Discussed healthy diet and regular exercise options   Encouraged to exercise at least 150 minutes per week with 2 days of strength training    Minette Brine, FNP

## 2018-07-07 NOTE — Patient Instructions (Signed)
Glucosamine tablets and capsules What is this medicine? GLUCOSAMINE (gloo KOH suh meen) is a dietary supplement. It is promoted to keep joints healthy and working smoothly. The FDA has not approved this supplement for any medical use. This supplement may be used for other purposes; ask your health care provider or pharmacist if you have questions. This medicine may be used for other purposes; ask your health care provider or pharmacist if you have questions. COMMON BRAND NAME(S): Genicin, OptiFlex-G What should I tell my health care provider before I take this medicine? They need to know if you have any of these conditions: -diabetes -kidney disease -liver disease -stomach or intestinal problems -an unusual or allergic reaction to glucosamine, other herbs, plants, medicines, foods, dyes, or preservatives -pregnant or trying to get pregnant -breast-feeding How should I use this medicine? Take this medicine by mouth with a glass of water. Follow the directions on the package labeling, or take as directed by your health care professional. Take your doses at regular intervals. If this medicine upsets your stomach, take it with food. Do not take this medicine more often than directed. Contact your pediatrician regarding the use of this medicine in children. Special care may be needed. Overdosage: If you think you have taken too much of this medicine contact a poison control center or emergency room at once. NOTE: This medicine is only for you. Do not share this medicine with others. What if I miss a dose? If you miss a dose, take it as soon as you can. If it is almost time for your next dose, take only that dose. Do not take double or extra doses. What may interact with this medicine? -warfarin This list may not describe all possible interactions. Give your health care provider a list of all the medicines, herbs, non-prescription drugs, or dietary supplements you use. Also tell them if you smoke,  drink alcohol, or use illegal drugs. Some items may interact with your medicine. What should I watch for while using this medicine? See your doctor if your symptoms do not get better or if they get worse. If you are scheduled for any medical or dental procedure, tell your healthcare provider that you are taking this medicine. You may need to stop taking this medicine before the procedure. Herbal or dietary supplements are not regulated like medicines. Rigid quality control standards are not required for dietary supplements. The purity and strength of these products can vary. The safety and effect of this dietary supplement for a certain disease or illness is not well known. This product is not intended to diagnose, treat, cure or prevent any disease. The Food and Drug Administration suggests the following to help consumers protect themselves: -Always read product labels and follow directions. -Natural does not mean a product is safe for humans to take. -Look for products that include USP after the ingredient name. This means that the manufacturer followed the standards of the [place] Pharmacopoeia. -Supplements made or sold by a nationally known food or drug company are more likely to be made under tight controls. You can write to the company for more information about how the product was made. What side effects may I notice from receiving this medicine? Side effects that you should report to your doctor or health care professional as soon as possible: -allergic reactions like skin rash, itching or hives, swelling of the face, lips, or tongue -breathing problems -constipation or diarrhea -loss of appetite -stomach pain Side effects that usually do not require  medical attention (report to your doctor or health care professional if they continue or are bothersome): -gas -headache -nausea -stomach upset This list may not describe all possible side effects. Call your doctor for medical advice about side  effects. You may report side effects to FDA at 1-800-FDA-1088. Where should I keep my medicine? Keep out of the reach of children. Store at room temperature or as directed on the package label. Protect from moisture. Throw away any unused medicine after the expiration date. NOTE: This sheet is a summary. It may not cover all possible information. If you have questions about this medicine, talk to your doctor, pharmacist, or health care provider.  2019 Elsevier/Gold Standard (2007-09-17 10:56:07)

## 2018-07-13 ENCOUNTER — Encounter: Payer: Self-pay | Admitting: Nurse Practitioner

## 2018-07-15 ENCOUNTER — Encounter: Payer: Self-pay | Admitting: Nurse Practitioner

## 2018-07-29 DIAGNOSIS — R0602 Shortness of breath: Secondary | ICD-10-CM | POA: Insufficient documentation

## 2018-07-29 DIAGNOSIS — M62838 Other muscle spasm: Secondary | ICD-10-CM | POA: Insufficient documentation

## 2018-07-29 DIAGNOSIS — M545 Low back pain: Principal | ICD-10-CM

## 2018-07-29 DIAGNOSIS — G8929 Other chronic pain: Secondary | ICD-10-CM | POA: Insufficient documentation

## 2018-10-01 ENCOUNTER — Other Ambulatory Visit: Payer: Self-pay

## 2018-10-01 ENCOUNTER — Encounter: Payer: BC Managed Care – PPO | Admitting: Nurse Practitioner

## 2018-10-01 ENCOUNTER — Ambulatory Visit: Payer: BC Managed Care – PPO | Admitting: Nurse Practitioner

## 2018-10-01 ENCOUNTER — Encounter: Payer: Self-pay | Admitting: Nurse Practitioner

## 2018-10-01 VITALS — BP 128/86 | HR 85 | Temp 97.9°F | Ht 65.6 in | Wt 216.2 lb

## 2018-10-01 DIAGNOSIS — E669 Obesity, unspecified: Secondary | ICD-10-CM | POA: Insufficient documentation

## 2018-10-01 DIAGNOSIS — Z1231 Encounter for screening mammogram for malignant neoplasm of breast: Secondary | ICD-10-CM

## 2018-10-01 DIAGNOSIS — Z1211 Encounter for screening for malignant neoplasm of colon: Secondary | ICD-10-CM | POA: Diagnosis not present

## 2018-10-01 DIAGNOSIS — R7303 Prediabetes: Secondary | ICD-10-CM

## 2018-10-01 DIAGNOSIS — Z1159 Encounter for screening for other viral diseases: Secondary | ICD-10-CM

## 2018-10-01 DIAGNOSIS — M62838 Other muscle spasm: Secondary | ICD-10-CM

## 2018-10-01 DIAGNOSIS — Z0000 Encounter for general adult medical examination without abnormal findings: Secondary | ICD-10-CM

## 2018-10-01 DIAGNOSIS — I10 Essential (primary) hypertension: Secondary | ICD-10-CM | POA: Insufficient documentation

## 2018-10-01 LAB — POCT URINALYSIS DIPSTICK
Glucose, UA: NEGATIVE
Ketones, UA: NEGATIVE
Nitrite, UA: NEGATIVE
Protein, UA: POSITIVE — AB
Spec Grav, UA: 1.025 (ref 1.010–1.025)
Urobilinogen, UA: 0.2 E.U./dL
pH, UA: 6 (ref 5.0–8.0)

## 2018-10-01 MED ORDER — CYCLOBENZAPRINE HCL 10 MG PO TABS: 10.0000 mg | ORAL_TABLET | Freq: Three times a day (TID) | ORAL | 0 refills | Status: DC | PRN

## 2018-10-01 NOTE — Progress Notes (Signed)
Subjective:     Patient ID: Kaylee Mendoza , female    DOB: Oct 09, 1960 , 58 y.o.   MRN: [id number]   Chief Complaint  Patient presents with  . Annual Exam   The patient states she uses post menopausal status for birth control. Last LMP was No LMP recorded. (Menstrual status: Perimenopausal).. Negative for Dysmenorrhea and Negative for Menorrhagia Mammogram last done 10/11/2016. Negative for: breast discharge, breast lump(s), breast pain and breast self exam.  Pertinent negatives include abnormal bleeding (hematology), anxiety, decreased libido, depression, difficulty falling sleep, dyspareunia, history of infertility, nocturia, sexual dysfunction, sleep disturbances, urinary incontinence, urinary urgency, vaginal discharge and vaginal itching. Diet regular.The patient states her exercise level is  minimal amount since COVID-19.     The patient's tobacco use is:  Social History   Tobacco Use  Smoking Status Never Smoker  Smokeless Tobacco Never Used  . She has been exposed to passive smoke. The patient's alcohol use is:  Social History   Substance and Sexual Activity  Alcohol Use Yes   Additional information: Last pap 06/10/2017, next one scheduled for 2022.  HPI  She is working from home  Here for [facility]    Past Medical History:  Diagnosis Date  . Hypertension      Family History  Problem Relation Age of Onset  . Diabetes Mother   . Heart disease Mother   . Diabetes Father      Current Outpatient Medications:  .  cyclobenzaprine (FLEXERIL) 10 MG tablet, Take 1 tablet (10 mg total) by mouth 3 (three) times daily as needed for muscle spasms., Disp: 30 tablet, Rfl: 0   No Known Allergies   Review of Systems  Constitutional: Negative.   HENT: Negative.   Eyes: Negative.   Respiratory: Negative.  Negative for cough.   Cardiovascular: Negative.  Negative for chest pain, palpitations and leg swelling.  Gastrointestinal: Negative.   Endocrine: Negative.   Genitourinary:  Negative.   Musculoskeletal: Negative.   Skin: Negative.   Allergic/Immunologic: Negative.   Neurological: Negative.   Hematological: Negative.   Psychiatric/Behavioral: Negative.      Today's Vitals   10/01/18 1124  BP: 128/86  Pulse: 85  Temp: 97.9 F (36.6 C)  TempSrc: Oral  Weight: 216 lb 3.2 oz (98.1 kg)  Height: 5' 5.6" (1.666 m)  PainSc: 0-No pain   Body mass index is 35.32 kg/m.   Objective:  Physical Exam Vitals signs reviewed.  Constitutional:      Appearance: Normal appearance. She is well-developed.  HENT:     Head: Normocephalic and atraumatic.     Right Ear: Hearing, tympanic membrane, ear canal and external ear normal. There is no impacted cerumen.     Left Ear: Hearing, tympanic membrane, ear canal and external ear normal. There is no impacted cerumen.     Nose: Nose normal.     Mouth/Throat:     Mouth: Mucous membranes are moist.  Eyes:     General: Lids are normal.     Conjunctiva/sclera: Conjunctivae normal.     Pupils: Pupils are equal, round, and reactive to light.     Funduscopic exam:    Right eye: No papilledema.        Left eye: No papilledema.  Neck:     Musculoskeletal: Full passive range of motion without pain, normal range of motion and neck supple.     Thyroid: No thyroid mass.     Vascular: No carotid bruit.  Cardiovascular:  Rate and Rhythm: Normal rate and regular rhythm.     Pulses: Normal pulses.     Heart sounds: Normal heart sounds. No murmur.  Pulmonary:     Effort: Pulmonary effort is normal.     Breath sounds: Normal breath sounds.  Abdominal:     General: Abdomen is flat. Bowel sounds are normal.     Palpations: Abdomen is soft.  Musculoskeletal: Normal range of motion.        General: No swelling.     Right lower leg: No edema.     Left lower leg: No edema.  Skin:    General: Skin is warm and dry.     Capillary Refill: Capillary refill takes less than 2 seconds.  Neurological:     General: No focal deficit  present.     Mental Status: She is alert and oriented to person, place, and time.     Cranial Nerves: No cranial nerve deficit.     Sensory: No sensory deficit.  Psychiatric:        Mood and Affect: Mood normal.        Behavior: Behavior normal.        Thought Content: Thought content normal.        Judgment: Judgment normal.         Assessment And Plan:     1. Encounter for general adult medical examination w/o abnormal findings . Behavior modifications discussed and diet history reviewed.   . Pt will continue to exercise regularly and modify diet with low GI, plant based foods and decrease intake of processed foods.  . Recommend intake of daily multivitamin, Vitamin D, and calcium.  . Recommend mammogram and colonoscopy for preventive screenings, as well as recommend immunizations that include influenza, TDAP, and Shingles - POCT Urinalysis Dipstick (81002)  2. Encounter for screening mammogram for breast cancer  Pt instructed on Self Breast Exam.According to ACOG guidelines Women aged 23 and older are recommended to get an annual mammogram. Form completed and given to patient contact the [facility] for appointment scheduing.   Pt encouraged to get annual mammogram - MM Digital Screening; Future  3. Encounter for screening colonoscopy  According to USPTF Colorectal cancer Screening guidelines. Colonoscopy is recommended every 10 years, starting at age 58years.  Will refer to GI for colon cancer screening. - Ambulatory referral to Gastroenterology  4. Obesity (BMI 35.0-39.9 without comorbidity)  Chronic  Discussed healthy diet and regular exercise options   Encouraged to exercise at least 150 minutes per week with 2 days of strength training  5. Essential hypertension B/P is much better controlled.  CMP ordered to check renal function.  The importance of regular exercise and dietary modification was stressed to the patient.  Stressed importance of losing ten  percent of her body weight to help with B/P control.       Minette Brine, FNP    THE PATIENT IS ENCOURAGED TO PRACTICE SOCIAL DISTANCING DUE TO THE COVID-19 PANDEMIC.

## 2018-10-01 NOTE — Patient Instructions (Signed)
Health Maintenance  Topic Date Due  . Hepatitis C Screening  Mar 02, 1961  . HIV Screening  03/23/1978  . TETANUS/TDAP  03/23/1982  . COLONOSCOPY  03/23/2013  . MAMMOGRAM  10/11/2018  . INFLUENZA VACCINE  12/20/2018  . PAP SMEAR-Modifier  06/10/2020   Health Maintenance, Female Adopting a healthy lifestyle and getting preventive care can go a long way to promote health and wellness. Talk with your health care provider about what schedule of regular examinations is right for you. This is a good chance for you to check in with your provider about disease prevention and staying healthy. In between checkups, there are plenty of things you can do on your own. Experts have done a lot of research about which lifestyle changes and preventive measures are most likely to keep you healthy. Ask your health care provider for more information. Weight and diet Eat a healthy diet  Be sure to include plenty of vegetables, fruits, low-fat dairy products, and lean protein.  Do not eat a lot of foods high in solid fats, added sugars, or salt.  Get regular exercise. This is one of the most important things you can do for your health. ? Most adults should exercise for at least 150 minutes each week. The exercise should increase your heart rate and make you sweat (moderate-intensity exercise). ? Most adults should also do strengthening exercises at least twice a week. This is in addition to the moderate-intensity exercise. Maintain a healthy weight  Body mass index (BMI) is a measurement that can be used to identify possible weight problems. It estimates body fat based on height and weight. Your health care provider can help determine your BMI and help you achieve or maintain a healthy weight.  For females 24 years of age and older: ? A BMI below 18.5 is considered underweight. ? A BMI of 18.5 to 24.9 is normal. ? A BMI of 25 to 29.9 is considered overweight. ? A BMI of 30 and above is considered obese. Watch  levels of cholesterol and blood lipids  You should start having your blood tested for lipids and cholesterol at 56 years of age, then have this test every 5 years.  You may need to have your cholesterol levels checked more often if: ? Your lipid or cholesterol levels are high. ? You are older than 56 years of age. ? You are at high risk for heart disease. Cancer screening Lung Cancer  Lung cancer screening is recommended for adults 38-18 years old who are at high risk for lung cancer because of a history of smoking.  A yearly low-dose CT scan of the lungs is recommended for people who: ? Currently smoke. ? Have quit within the past 15 years. ? Have at least a 30-pack-year history of smoking. A pack year is smoking an average of one pack of cigarettes a day for 1 year.  Yearly screening should continue until it has been 15 years since you quit.  Yearly screening should stop if you develop a health problem that would prevent you from having lung cancer treatment. Breast Cancer  Practice breast self-awareness. This means understanding how your breasts normally appear and feel.  It also means doing regular breast self-exams. Let your health care provider know about any changes, no matter how small.  If you are in your 20s or 30s, you should have a clinical breast exam (CBE) by a health care provider every 1-3 years as part of a regular health exam.  If you  are 79 or older, have a CBE every year. Also consider having a breast X-ray (mammogram) every year.  If you have a family history of breast cancer, talk to your health care provider about genetic screening.  If you are at high risk for breast cancer, talk to your health care provider about having an MRI and a mammogram every year.  Breast cancer gene (BRCA) assessment is recommended for women who have family members with BRCA-related cancers. BRCA-related cancers include: ? Breast. ? Ovarian. ? Tubal. ? Peritoneal  cancers.  Results of the assessment will determine the need for genetic counseling and BRCA1 and BRCA2 testing. Cervical Cancer Your health care provider may recommend that you be screened regularly for cancer of the pelvic organs (ovaries, uterus, and vagina). This screening involves a pelvic examination, including checking for microscopic changes to the surface of your cervix (Pap test). You may be encouraged to have this screening done every 3 years, beginning at age 97.  For women ages 59-65, health care providers may recommend pelvic exams and Pap testing every 3 years, or they may recommend the Pap and pelvic exam, combined with testing for human papilloma virus (HPV), every 5 years. Some types of HPV increase your risk of cervical cancer. Testing for HPV may also be done on women of any age with unclear Pap test results.  Other health care providers may not recommend any screening for nonpregnant women who are considered low risk for pelvic cancer and who do not have symptoms. Ask your health care provider if a screening pelvic exam is right for you.  If you have had past treatment for cervical cancer or a condition that could lead to cancer, you need Pap tests and screening for cancer for at least 20 years after your treatment. If Pap tests have been discontinued, your risk factors (such as having a new sexual partner) need to be reassessed to determine if screening should resume. Some women have medical problems that increase the chance of getting cervical cancer. In these cases, your health care provider may recommend more frequent screening and Pap tests. Colorectal Cancer  This type of cancer can be detected and often prevented.  Routine colorectal cancer screening usually begins at 56 years of age and continues through 56 years of age.  Your health care provider may recommend screening at an earlier age if you have risk factors for colon cancer.  Your health care provider may also  recommend using home test kits to check for hidden blood in the stool.  A small camera at the end of a tube can be used to examine your colon directly (sigmoidoscopy or colonoscopy). This is done to check for the earliest forms of colorectal cancer.  Routine screening usually begins at age 37.  Direct examination of the colon should be repeated every 5-10 years through 56 years of age. However, you may need to be screened more often if early forms of precancerous polyps or small growths are found. Skin Cancer  Check your skin from head to toe regularly.  Tell your health care provider about any new moles or changes in moles, especially if there is a change in a mole's shape or color.  Also tell your health care provider if you have a mole that is larger than the size of a pencil eraser.  Always use sunscreen. Apply sunscreen liberally and repeatedly throughout the day.  Protect yourself by wearing long sleeves, pants, a wide-brimmed hat, and sunglasses whenever you are outside.  Heart disease, diabetes, and high blood pressure  High blood pressure causes heart disease and increases the risk of stroke. High blood pressure is more likely to develop in: ? People who have blood pressure in the high end of the normal range (130-139/85-89 mm Hg). ? People who are overweight or obese. ? People who are African American.  If you are 53-43 years of age, have your blood pressure checked every 3-5 years. If you are 62 years of age or older, have your blood pressure checked every year. You should have your blood pressure measured twice-once when you are at a hospital or clinic, and once when you are not at a hospital or clinic. Record the average of the two measurements. To check your blood pressure when you are not at a hospital or clinic, you can use: ? An automated blood pressure machine at a pharmacy. ? A home blood pressure monitor.  If you are between 38 years and 33 years old, ask your health  care provider if you should take aspirin to prevent strokes.  Have regular diabetes screenings. This involves taking a blood sample to check your fasting blood sugar level. ? If you are at a normal weight and have a low risk for diabetes, have this test once every three years after 56 years of age. ? If you are overweight and have a high risk for diabetes, consider being tested at a younger age or more often. Preventing infection Hepatitis B  If you have a higher risk for hepatitis B, you should be screened for this virus. You are considered at high risk for hepatitis B if: ? You were born in a country where hepatitis B is common. Ask your health care provider which countries are considered high risk. ? Your parents were born in a high-risk country, and you have not been immunized against hepatitis B (hepatitis B vaccine). ? You have HIV or AIDS. ? You use needles to inject street drugs. ? You live with someone who has hepatitis B. ? You have had sex with someone who has hepatitis B. ? You get hemodialysis treatment. ? You take certain medicines for conditions, including cancer, organ transplantation, and autoimmune conditions. Hepatitis C  Blood testing is recommended for: ? Everyone born from [birth year] through 1963. ? Anyone with known risk factors for hepatitis C. Sexually transmitted infections (STIs)  You should be screened for sexually transmitted infections (STIs) including gonorrhea and chlamydia if: ? You are sexually active and are younger than 56 years of age. ? You are older than 57 years of age and your health care provider tells you that you are at risk for this type of infection. ? Your sexual activity has changed since you were last screened and you are at an increased risk for chlamydia or gonorrhea. Ask your health care provider if you are at risk.  If you do not have HIV, but are at risk, it may be recommended that you take a prescription medicine daily to prevent HIV  infection. This is called pre-exposure prophylaxis (PrEP). You are considered at risk if: ? You are sexually active and do not regularly use condoms or know the HIV status of your partner(s). ? You take drugs by injection. ? You are sexually active with a partner who has HIV. Talk with your health care provider about whether you are at high risk of being infected with HIV. If you choose to begin PrEP, you should first be tested for HIV. You should then  be tested every 3 months for as long as you are taking PrEP. Pregnancy  If you are premenopausal and you may become pregnant, ask your health care provider about preconception counseling.  If you may become pregnant, take 400 to 800 micrograms (mcg) of folic acid every day.  If you want to prevent pregnancy, talk to your health care provider about birth control (contraception). Osteoporosis and menopause  Osteoporosis is a disease in which the bones lose minerals and strength with aging. This can result in serious bone fractures. Your risk for osteoporosis can be identified using a bone density scan.  If you are 10 years of age or older, or if you are at risk for osteoporosis and fractures, ask your health care provider if you should be screened.  Ask your health care provider whether you should take a calcium or vitamin D supplement to lower your risk for osteoporosis.  Menopause may have certain physical symptoms and risks.  Hormone replacement therapy may reduce some of these symptoms and risks. Talk to your health care provider about whether hormone replacement therapy is right for you. Follow these instructions at home:  Schedule regular health, dental, and eye exams.  Stay current with your immunizations.  Do not use any tobacco products including cigarettes, chewing tobacco, or electronic cigarettes.  If you are pregnant, do not drink alcohol.  If you are breastfeeding, limit how much and how often you drink alcohol.  Limit  alcohol intake to no more than 1 drink per day for nonpregnant women. One drink equals 12 ounces of beer, 5 ounces of wine, or 1 ounces of hard liquor.  Do not use street drugs.  Do not share needles.  Ask your health care provider for help if you need support or information about quitting drugs.  Tell your health care provider if you often feel depressed.  Tell your health care provider if you have ever been abused or do not feel safe at home. This information is not intended to replace advice given to you by your health care provider. Make sure you discuss any questions you have with your health care provider. Document Released: 11/20/2010 Document Revised: 10/13/2015 Document Reviewed: 02/08/2015 Elsevier Interactive Patient Education  2019 [employer].

## 2018-10-02 LAB — CMP14 + ANION GAP
ALT: 20 IU/L (ref 0–32)
AST: 18 IU/L (ref 0–40)
Albumin/Globulin Ratio: 1.3 (ref 1.2–2.2)
Albumin: 4.4 g/dL (ref 3.8–4.9)
Alkaline Phosphatase: 71 IU/L (ref 39–117)
Anion Gap: 15 mmol/L (ref 10.0–18.0)
BUN/Creatinine Ratio: 13 (ref 9–23)
BUN: 16 mg/dL (ref 6–24)
Bilirubin Total: 0.2 mg/dL (ref 0.0–1.2)
CO2: 25 mmol/L (ref 20–29)
Calcium: 9.9 mg/dL (ref 8.7–10.2)
Chloride: 101 mmol/L (ref 96–106)
Creatinine, Ser: 1.2 mg/dL — ABNORMAL HIGH (ref 0.57–1.00)
GFR calc Af Amer: 59 mL/min/{1.73_m2} — ABNORMAL LOW (ref >59)
GFR calc non Af Amer: 51 mL/min/{1.73_m2} — ABNORMAL LOW (ref >59)
Globulin, Total: 3.5 g/dL (ref 1.5–4.5)
Glucose: 86 mg/dL (ref 65–99)
Potassium: 4.5 mmol/L (ref 3.5–5.2)
Sodium: 141 mmol/L (ref 134–144)
Total Protein: 7.9 g/dL (ref 6.0–8.5)

## 2018-10-02 LAB — HEPATITIS C ANTIBODY: Hep C Virus Ab: <0.1 s/co ratio (ref 0.0–0.9)

## 2018-10-02 LAB — CBC
Hematocrit: 41.2 % (ref 34.0–46.6)
Hemoglobin: 13.5 g/dL (ref 11.1–15.9)
MCH: 26.9 pg (ref 26.6–33.0)
MCHC: 32.8 g/dL (ref 31.5–35.7)
MCV: 82 fL (ref 79–97)
Platelets: 381 10*3/uL (ref 150–450)
RBC: 5.02 x10E6/uL (ref 3.77–5.28)
RDW: 14.1 % (ref 11.7–15.4)
WBC: 10.2 10*3/uL (ref 3.4–10.8)

## 2018-10-02 LAB — HEMOGLOBIN A1C
Est. average glucose Bld gHb Est-mCnc: 137 mg/dL
Hgb A1c MFr Bld: 6.4 % — ABNORMAL HIGH (ref 4.8–5.6)

## 2018-10-09 ENCOUNTER — Other Ambulatory Visit: Payer: Self-pay

## 2018-10-09 ENCOUNTER — Telehealth: Payer: Self-pay

## 2018-10-09 MED ORDER — VITAMIN D (ERGOCALCIFEROL) 1.25 MG (50000 UNIT) PO CAPS: 50000.0000 [IU] | ORAL_CAPSULE | ORAL | 0 refills | Status: DC

## 2018-10-09 NOTE — Telephone Encounter (Signed)
I called pt and notified her of her vitamin d level 18.2 and also let her know we sent a Rx for Vitamin D for her to take for 3 months and we will recheck it when she comes in August. [facility]

## 2018-10-09 NOTE — Progress Notes (Signed)
V

## 2018-10-28 ENCOUNTER — Other Ambulatory Visit: Payer: Self-pay | Admitting: Nurse Practitioner

## 2018-10-28 LAB — SPECIMEN STATUS REPORT

## 2018-10-28 LAB — VITAMIN D 25 HYDROXY (VIT D DEFICIENCY, FRACTURES): Vit D, 25-Hydroxy: 18.2 ng/mL — ABNORMAL LOW (ref 30.0–100.0)

## 2018-10-28 MED ORDER — VITAMIN D (ERGOCALCIFEROL) 1.25 MG (50000 UNIT) PO CAPS: 50000.0000 [IU] | ORAL_CAPSULE | ORAL | 0 refills | Status: DC

## 2018-10-28 NOTE — Progress Notes (Signed)
Vitamin d is low at 18.2 I will send a Rx for vitamin d 50,000 units twice a week for 12 weeks, we will recheck

## 2018-11-03 ENCOUNTER — Encounter: Payer: Self-pay | Admitting: Internal Medicine

## 2018-11-14 ENCOUNTER — Ambulatory Visit: Payer: Self-pay | Admitting: *Deleted

## 2018-11-14 ENCOUNTER — Other Ambulatory Visit: Payer: Self-pay

## 2018-11-14 VITALS — Ht 66.0 in | Wt 210.0 lb

## 2018-11-14 DIAGNOSIS — Z800 Family history of malignant neoplasm of digestive organs: Secondary | ICD-10-CM

## 2018-11-14 DIAGNOSIS — Z1211 Encounter for screening for malignant neoplasm of colon: Secondary | ICD-10-CM

## 2018-11-14 MED ORDER — NA SULFATE-K SULFATE-MG SULF 17.5-3.13-1.6 GM/177ML PO SOLN: ORAL | 0 refills | Status: DC

## 2018-11-14 NOTE — Progress Notes (Signed)
Patient's pre-visit was done today over the phone with the patient due to COVID-19 pandemic. Name,DOB and address verified. Insurance verified. Packet of Prep instructions mailed to patient including copy of a consent form and pre-procedure patient acknowledgement form-pt is aware. Suprep $15 Coupon included. Patient understands to call us back with any questions or concerns. Patient denies any allergies to eggs or soy. Patient denies any problems with anesthesia/sedation. Patient denies any oxygen use at home. Patient denies taking any diet/weight loss medications or blood thinners. EMMI education assisgned to patient on colonoscopy, this was explained and instructions given to patient. Pt is aware that care partner will wait in the car during parking lot; if they feel like they will be too hot to wait in the car; they may wait in the lobby.  We want them to wear a mask (we do not have any that we can provide them), practice social distancing, and we will check their temperatures when they get here.  I did remind patient that their care partner needs to stay in the parking lot the entire time. Pt will wear mask into building

## 2018-11-24 ENCOUNTER — Telehealth: Payer: Self-pay | Admitting: Internal Medicine

## 2018-11-24 NOTE — Telephone Encounter (Signed)

## 2018-11-25 ENCOUNTER — Ambulatory Visit (AMBULATORY_SURGERY_CENTER): Payer: BC Managed Care – PPO | Admitting: Internal Medicine

## 2018-11-25 ENCOUNTER — Other Ambulatory Visit: Payer: Self-pay

## 2018-11-25 ENCOUNTER — Encounter: Payer: Self-pay | Admitting: Internal Medicine

## 2018-11-25 VITALS — BP 144/85 | HR 60 | Temp 97.4°F | Resp 16 | Ht 65.0 in | Wt 216.0 lb

## 2018-11-25 DIAGNOSIS — D128 Benign neoplasm of rectum: Secondary | ICD-10-CM

## 2018-11-25 DIAGNOSIS — Z1211 Encounter for screening for malignant neoplasm of colon: Secondary | ICD-10-CM

## 2018-11-25 DIAGNOSIS — K635 Polyp of colon: Secondary | ICD-10-CM | POA: Diagnosis not present

## 2018-11-25 DIAGNOSIS — Z800 Family history of malignant neoplasm of digestive organs: Secondary | ICD-10-CM | POA: Diagnosis not present

## 2018-11-25 DIAGNOSIS — D125 Benign neoplasm of sigmoid colon: Secondary | ICD-10-CM

## 2018-11-25 MED ORDER — SODIUM CHLORIDE 0.9 % IV SOLN: 500.0000 mL | Freq: Once | INTRAVENOUS | Status: DC

## 2018-11-25 NOTE — Progress Notes (Signed)
Pt's states no medical or surgical changes since previsit or office visit. 

## 2018-11-25 NOTE — Progress Notes (Signed)
A/ox3, pleased with MAC, report to RN 

## 2018-11-25 NOTE — Progress Notes (Signed)
Called to room to assist during endoscopic procedure.  Patient ID and intended procedure confirmed with present staff. Received instructions for my participation in the procedure from the performing physician.  

## 2018-11-25 NOTE — Patient Instructions (Signed)
No Aspirin or Aspirin products, NSAIDS(Motrin,Ibuprofen,Aleve,Naprosyn,Advil etc) for 2 weeks, July 21,2020  Handouts given: Polyps, Diverticulosis and Hemorrhoids   YOU HAD AN ENDOSCOPIC PROCEDURE TODAY AT [facility]:   Refer to the procedure report that was given to you for any specific questions about what was found during the examination.  If the procedure report does not answer your questions, please call your gastroenterologist to clarify.  If you requested that your care partner not be given the details of your procedure findings, then the procedure report has been included in a sealed envelope for you to review at your convenience later.  YOU SHOULD EXPECT: Some feelings of bloating in the abdomen. Passage of more gas than usual.  Walking can help get rid of the air that was put into your GI tract during the procedure and reduce the bloating. If you had a lower endoscopy (such as a colonoscopy or flexible sigmoidoscopy) you may notice spotting of blood in your stool or on the toilet paper. If you underwent a bowel prep for your procedure, you may not have a normal bowel movement for a few days.  Please Note:  You might notice some irritation and congestion in your nose or some drainage.  This is from the oxygen used during your procedure.  There is no need for concern and it should clear up in a day or so.  SYMPTOMS TO REPORT IMMEDIATELY:   Following lower endoscopy (colonoscopy or flexible sigmoidoscopy):  Excessive amounts of blood in the stool  Significant tenderness or worsening of abdominal pains  Swelling of the abdomen that is new, acute  Fever of 100F or higher   For urgent or emergent issues, a gastroenterologist can be reached at any hour by calling [phone].   DIET:  We do recommend a small meal at first, but then you may proceed to your regular diet.  Drink plenty of fluids but you should avoid alcoholic beverages for 24 hours.  ACTIVITY:  You  should plan to take it easy for the rest of today and you should NOT DRIVE or use heavy machinery until tomorrow (because of the sedation medicines used during the test).    FOLLOW UP: Our staff will call the number listed on your records 48-72 hours following your procedure to check on you and address any questions or concerns that you may have regarding the information given to you following your procedure. If we do not reach you, we will leave a message.  We will attempt to reach you two times.  During this call, we will ask if you have developed any symptoms of COVID 19. If you develop any symptoms (ie: fever, flu-like symptoms, shortness of breath, cough etc.) before then, please call [phone].  If you test positive for Covid 19 in the 2 weeks post procedure, please call and report this information to [place].    If any biopsies were taken you will be contacted by phone or by letter within the next 1-3 weeks.  Please call us at [phone] if you have not heard about the biopsies in 3 weeks.    SIGNATURES/CONFIDENTIALITY: You and/or your care partner have signed paperwork which will be entered into your electronic medical record.  These signatures attest to the fact that that the information above on your After Visit Summary has been reviewed and is understood.  Full responsibility of the confidentiality of this discharge information lies with you and/or your care-partner.

## 2018-11-25 NOTE — Op Note (Signed)
[facility] Patient Name: Kaylee Mendoza Procedure Date: 11/25/2018 7:29 AM MRN: [id number] Endoscopist: Jerene Bears , MD Age: 58 Referring MD:  Date of Birth: July 17, 1960 Gender: Female Account #: [id number] Procedure:                Colonoscopy Indications:              Screening in patient at increased risk: Family                            history of 1st-degree relative (brother) with                            colorectal cancer before age 12 years, This is the                            patient's first colonoscopy Medicines:                Monitored Anesthesia Care Procedure:                Pre-Anesthesia Assessment:                           - Prior to the procedure, a History and Physical                            was performed, and patient medications and                            allergies were reviewed. The patient's tolerance of                            previous anesthesia was also reviewed. The risks                            and benefits of the procedure and the sedation                            options and risks were discussed with the patient.                            All questions were answered, and informed consent                            was obtained. Prior Anticoagulants: The patient has                            taken no previous anticoagulant or antiplatelet                            agents. ASA Grade Assessment: II - A patient with                            mild systemic disease. After reviewing the risks  and benefits, the patient was deemed in                            satisfactory condition to undergo the procedure.                           After obtaining informed consent, the colonoscope                            was passed under direct vision. Throughout the                            procedure, the patient's blood pressure, pulse, and                            oxygen saturations were monitored  continuously. The                            Colonoscope was introduced through the anus and                            advanced to the cecum, identified by appendiceal                            orifice and ileocecal valve. The colonoscopy was                            performed without difficulty. The patient tolerated                            the procedure well. The quality of the bowel                            preparation was good. The ileocecal valve,                            appendiceal orifice, and rectum were photographed. Scope In: 8:27:29 AM Scope Out: 8:44:53 AM Scope Withdrawal Time: 0 hours 12 minutes 36 seconds  Total Procedure Duration: 0 hours 17 minutes 24 seconds  Findings:                 The digital rectal exam was normal.                           A 4 mm polyp was found in the sigmoid colon. The                            polyp was sessile. The polyp was removed with a                            cold snare. Resection and retrieval were complete.                           A 8 mm polyp was found  in the rectum. The polyp was                            pedunculated. The polyp was removed with a hot                            snare. Resection and retrieval were complete.                           Scattered medium-mouthed diverticula were found in                            the sigmoid colon and descending colon.                           Internal hemorrhoids were found during                            retroflexion. The hemorrhoids were small. Complications:            No immediate complications. Estimated Blood Loss:     Estimated blood loss was minimal. Impression:               - One 4 mm polyp in the sigmoid colon, removed with                            a cold snare. Resected and retrieved.                           - One 8 mm polyp in the rectum, removed with a hot                            snare. Resected and retrieved.                           -  Diverticulosis in the sigmoid colon and in the                            descending colon.                           - Internal hemorrhoids. Recommendation:           - Patient has a contact number available for                            emergencies. The signs and symptoms of potential                            delayed complications were discussed with the                            patient. Return to normal activities tomorrow.                            Written discharge instructions  were provided to the                            patient.                           - Resume previous diet.                           - Continue present medications.                           - Await pathology results.                           - Repeat colonoscopy is recommended for                            surveillance. The colonoscopy date will be                            determined after pathology results from today's                            exam become available for review.                           - No ibuprofen, naproxen, or other non-steroidal                            anti-inflammatory drugs for 2 weeks after polyp                            removal. Jerene Bears, MD 11/25/2018 8:50:34 AM This report has been signed electronically.

## 2018-11-27 ENCOUNTER — Telehealth: Payer: Self-pay | Admitting: *Deleted

## 2018-11-27 NOTE — Telephone Encounter (Signed)
  Follow up Call-  Call back number 11/25/2018  Post procedure Call Back phone  # [phone]  Permission to leave phone message Yes  Some recent data might be hidden     Patient questions:  Do you have a fever, pain , or abdominal swelling? No. Pain Score  0 *  Have you tolerated food without any problems? Yes.    Have you been able to return to your normal activities? Yes.    Do you have any questions about your discharge instructions: Diet   No. Medications  No. Follow up visit  No.  Do you have questions or concerns about your Care? No.  Actions: * If pain score is 4 or above: No action needed, pain <4.  1. Have you developed a fever since your procedure? NO  2.   Have you had an respiratory symptoms (SOB or cough) since your procedure? NO  3.   Have you tested positive for COVID 19 since your procedure NO  4.   Have you had any family members/close contacts diagnosed with the COVID 19 since your procedure?  NO   If yes to any of these questions please route to Joylene John, RN and Alphonsa Gin, RN.

## 2018-12-02 ENCOUNTER — Encounter: Payer: Self-pay | Admitting: Internal Medicine

## 2018-12-26 ENCOUNTER — Telehealth: Payer: Self-pay

## 2018-12-26 NOTE — Telephone Encounter (Signed)
Patient called stating she had a referral for a mammogram but has not heard back about an appointment. I have returned her call after looking at her notes from last visit and she can just [facility] a call for an appointment. Pt made aware. YRL,RMA

## 2019-01-01 ENCOUNTER — Ambulatory Visit (INDEPENDENT_AMBULATORY_CARE_PROVIDER_SITE_OTHER): Payer: BC Managed Care – PPO | Admitting: Nurse Practitioner

## 2019-01-01 ENCOUNTER — Encounter: Payer: Self-pay | Admitting: Nurse Practitioner

## 2019-01-01 ENCOUNTER — Other Ambulatory Visit: Payer: Self-pay

## 2019-01-01 VITALS — BP 134/82 | HR 84 | Temp 97.9°F | Ht 67.2 in | Wt 218.6 lb

## 2019-01-01 DIAGNOSIS — Z6834 Body mass index (BMI) 34.0-34.9, adult: Secondary | ICD-10-CM

## 2019-01-01 DIAGNOSIS — I10 Essential (primary) hypertension: Secondary | ICD-10-CM | POA: Diagnosis not present

## 2019-01-01 DIAGNOSIS — R7303 Prediabetes: Secondary | ICD-10-CM

## 2019-01-01 DIAGNOSIS — E6609 Other obesity due to excess calories: Secondary | ICD-10-CM | POA: Diagnosis not present

## 2019-01-01 NOTE — Patient Instructions (Addendum)
Exercising to Lose Weight Exercise is structured, repetitive physical activity to improve fitness and health. Getting regular exercise is important for everyone. It is especially important if you are overweight. Being overweight increases your risk of heart disease, stroke, diabetes, high blood pressure, and several types of cancer. Reducing your calorie intake and exercising can help you lose weight. Exercise is usually categorized as moderate or vigorous intensity. To lose weight, most people need to do a certain amount of moderate-intensity or vigorous-intensity exercise each week. Moderate-intensity exercise  Moderate-intensity exercise is any activity that gets you moving enough to burn at least three times more energy (calories) than if you were sitting. Examples of moderate exercise include:  Walking a mile in 15 minutes.  Doing light yard work.  Biking at an easy pace. Most people should get at least 150 minutes (2 hours and 30 minutes) a week of moderate-intensity exercise to maintain their body weight. Vigorous-intensity exercise Vigorous-intensity exercise is any activity that gets you moving enough to burn at least six times more calories than if you were sitting. When you exercise at this intensity, you should be working hard enough that you are not able to carry on a conversation. Examples of vigorous exercise include:  Running.  Playing a team sport, such as football, basketball, and soccer.  Jumping rope. Most people should get at least 75 minutes (1 hour and 15 minutes) a week of vigorous-intensity exercise to maintain their body weight. How can exercise affect me? When you exercise enough to burn more calories than you eat, you lose weight. Exercise also reduces body fat and builds muscle. The more muscle you have, the more calories you burn. Exercise also:  Improves mood.  Reduces stress and tension.  Improves your overall fitness, flexibility, and endurance.   Increases bone strength. The amount of exercise you need to lose weight depends on:  Your age.  The type of exercise.  Any health conditions you have.  Your overall physical ability. Talk to your health care provider about how much exercise you need and what types of activities are safe for you. What actions can I take to lose weight? Nutrition   Make changes to your diet as told by your health care provider or diet and nutrition specialist (dietitian). This may include: ? Eating fewer calories. ? Eating more protein. ? Eating less unhealthy fats. ? Eating a diet that includes fresh fruits and vegetables, whole grains, low-fat dairy products, and lean protein. ? Avoiding foods with added fat, salt, and sugar.  Drink plenty of water while you exercise to prevent dehydration or heat stroke. Activity  Choose an activity that you enjoy and set realistic goals. Your health care provider can help you make an exercise plan that works for you.  Exercise at a moderate or vigorous intensity most days of the week. ? The intensity of exercise may vary from person to person. You can tell how intense a workout is for you by paying attention to your breathing and heartbeat. Most people will notice their breathing and heartbeat get faster with more intense exercise.  Do resistance training twice each week, such as: ? Push-ups. ? Sit-ups. ? Lifting weights. ? Using resistance bands.  Getting short amounts of exercise can be just as helpful as long structured periods of exercise. If you have trouble finding time to exercise, try to include exercise in your daily routine. ? Get up, stretch, and walk around every 30 minutes throughout the day. ? Go for a   walk during your lunch break. ? Park your car farther away from your destination. ? If you take public transportation, get off one stop early and walk the rest of the way. ? Make phone calls while standing up and walking around. ? Take the  stairs instead of elevators or escalators.  Wear comfortable clothes and shoes with good support.  Do not exercise so much that you hurt yourself, feel dizzy, or get very short of breath. Where to find more information  [place] Department of Health and Human Services: [employer]at  Centers for Disease Control and Prevention (CDC): http://www.wolf.info/ Contact a health care provider:  Before starting a new exercise program.  If you have questions or concerns about your weight.  If you have a medical problem that keeps you from exercising. Get help right away if you have any of the following while exercising:  Injury.  Dizziness.  Difficulty breathing or shortness of breath that does not go away when you stop exercising.  Chest pain.  Rapid heartbeat. Summary  Being overweight increases your risk of heart disease, stroke, diabetes, high blood pressure, and several types of cancer.  Losing weight happens when you burn more calories than you eat.  Reducing the amount of calories you eat in addition to getting regular moderate or vigorous exercise each week helps you lose weight. This information is not intended to replace advice given to you by your health care provider. Make sure you discuss any questions you have with your health care provider. Document Released: 06/09/2010 Document Revised: 05/20/2017 Document Reviewed: 05/20/2017 Elsevier Patient Education  2020 [employer].    Goal to lose 10 pounds in the next 8 weeks  Use myfitnesspal as a guide for food log  Saxenda trial - if any symptoms of swallowing difficulty or abdominal pain return call to office   Drink at least one gallon of water a day

## 2019-01-01 NOTE — Progress Notes (Signed)
Subjective:     Patient ID: Kaylee Mendoza , female    DOB: 1960-08-18 , 58 y.o.   MRN: [id number]   Chief Complaint  Patient presents with  . Hypertension    patient presents today for a 3 month f/u  . prediabetes    HPI  Obesity - She has been walking 3-4 miles - 5 days a week.  She has not seen any weight loss but has noticed a change in how her clothes are fitting.   She does not feel like she snacks.  She also has an issue with occasionally feeling "lazy".  She wants to lose weight to be more healthy.    Hypertension This is a chronic problem. The current episode started more than 1 year ago. The problem is uncontrolled. Pertinent negatives include no chest pain, headaches, palpitations or shortness of breath. There are no associated agents to hypertension. Risk factors for coronary artery disease include obesity and sedentary lifestyle. Past treatments include nothing.     Past Medical History:  Diagnosis Date  . Hypertension    pt denies-no tx  . Muscle spasm      Family History  Problem Relation Age of Onset  . Diabetes Mother   . Heart disease Mother   . Diabetes Father   . Colon polyps Sister   . Colon cancer Brother 34  . Esophageal cancer Neg Hx   . Rectal cancer Neg Hx   . Stomach cancer Neg Hx      Current Outpatient Medications:  .  cyclobenzaprine (FLEXERIL) 10 MG tablet, Take 1 tablet (10 mg total) by mouth 3 (three) times daily as needed for muscle spasms., Disp: 30 tablet, Rfl: 0 .  Vitamin D, Ergocalciferol, (DRISDOL) 1.25 MG (50000 UT) CAPS capsule, Take 1 capsule (50,000 Units total) by mouth 2 (two) times a week., Disp: 24 capsule, Rfl: 0   No Known Allergies   Review of Systems  Constitutional: Negative.   Respiratory: Negative for shortness of breath.   Cardiovascular: Negative.  Negative for chest pain, palpitations and leg swelling.  Endocrine: Negative for polydipsia, polyphagia and polyuria.  Musculoskeletal: Positive for back pain.  Negative for myalgias.  Skin: Negative.   Neurological: Negative for dizziness and headaches.     Today's Vitals   01/01/19 1052  BP: 134/82  Pulse: 84  Temp: 97.9 F (36.6 C)  TempSrc: Oral  Weight: 218 lb 9.6 oz (99.2 kg)  Height: 5' 7.2" (1.707 m)  PainSc: 4   PainLoc: Back   Body mass index is 34.03 kg/m.   Objective:  Physical Exam Constitutional:      Appearance: Normal appearance.  Cardiovascular:     Rate and Rhythm: Normal rate and regular rhythm.     Pulses: Normal pulses.     Heart sounds: Normal heart sounds. No murmur.  Pulmonary:     Effort: No respiratory distress.     Breath sounds: Normal breath sounds.  Neurological:     General: No focal deficit present.     Mental Status: She is alert and oriented to person, place, and time.  Psychiatric:        Mood and Affect: Mood normal.        Behavior: Behavior normal.        Thought Content: Thought content normal.        Judgment: Judgment normal.         Assessment And Plan:    1. Blood pressure elevated without history of HTN .  B/P is better controlled.  . The importance of regular exercise and dietary modification was stressed to the patient.  . Stressed importance of losing ten percent of her body weight to help with B/P control.  . The weight loss would help with decreasing cardiac and cancer risk as well.   2. Prediabetes  Chronic, fair control  Continue with current medications  Encouraged to limit intake of sugary foods and drinks  Encouraged to increase physical activity to 150 minutes per week  3. Class 1 obesity due to excess calories without serious comorbidity with body mass index (BMI) of 34.0 to 34.9 in adult ASK- Patient initiated conversation, given permission to discuss weight today ASSESS - Body mass index is 34.03 kg/m., well tolerated, WAIST CIRCUMFERENCE  ADVISE  - discussed the importance of not wanting to just lose weight and not focus on exercising to improve cardiac  health. AGREE - to walk at least 30 minutes per day, long term goal of 160 pounds. 8 week goal to lose 10 lbs   ASSIST - will start on Saxenda, sample given in office while waiting for insurance approval.        Minette Brine, FNP

## 2019-01-09 ENCOUNTER — Other Ambulatory Visit: Payer: Self-pay | Admitting: Nurse Practitioner

## 2019-01-09 DIAGNOSIS — M62838 Other muscle spasm: Secondary | ICD-10-CM

## 2019-01-12 NOTE — Telephone Encounter (Signed)
Please refill patient's prescription 

## 2019-01-21 ENCOUNTER — Other Ambulatory Visit: Payer: Self-pay

## 2019-01-21 DIAGNOSIS — M62838 Other muscle spasm: Secondary | ICD-10-CM

## 2019-01-21 MED ORDER — CYCLOBENZAPRINE HCL 10 MG PO TABS: 10.0000 mg | ORAL_TABLET | Freq: Three times a day (TID) | ORAL | 0 refills | Status: DC | PRN

## 2019-02-19 ENCOUNTER — Ambulatory Visit
Admission: RE | Admit: 2019-02-19 | Discharge: 2019-02-19 | Disposition: A | Discharge status: Home / Self Care | Payer: BC Managed Care – PPO | Source: Ambulatory Visit | Attending: Nurse Practitioner | Admitting: Nurse Practitioner

## 2019-02-19 ENCOUNTER — Other Ambulatory Visit: Payer: Self-pay

## 2019-02-19 DIAGNOSIS — Z1231 Encounter for screening mammogram for malignant neoplasm of breast: Secondary | ICD-10-CM

## 2019-02-25 ENCOUNTER — Ambulatory Visit: Payer: BC Managed Care – PPO | Admitting: Nurse Practitioner

## 2019-03-19 ENCOUNTER — Encounter: Payer: Self-pay | Admitting: Nurse Practitioner

## 2019-03-19 ENCOUNTER — Other Ambulatory Visit: Payer: Self-pay

## 2019-03-19 ENCOUNTER — Ambulatory Visit: Payer: BC Managed Care – PPO | Admitting: Nurse Practitioner

## 2019-03-19 VITALS — BP 128/82 | HR 85 | Temp 98.1°F | Ht 67.0 in | Wt 221.0 lb

## 2019-03-19 DIAGNOSIS — M25561 Pain in right knee: Secondary | ICD-10-CM | POA: Diagnosis not present

## 2019-03-19 DIAGNOSIS — G8929 Other chronic pain: Secondary | ICD-10-CM

## 2019-03-19 DIAGNOSIS — E66811 Other obesity due to excess calories: Secondary | ICD-10-CM

## 2019-03-19 DIAGNOSIS — E6609 Other obesity due to excess calories: Secondary | ICD-10-CM

## 2019-03-19 DIAGNOSIS — Z6834 Body mass index (BMI) 34.0-34.9, adult: Secondary | ICD-10-CM | POA: Diagnosis not present

## 2019-03-19 MED ORDER — SAXENDA 18 MG/3ML ~~LOC~~ SOPN: 3.0000 mg | PEN_INJECTOR | Freq: Every day | SUBCUTANEOUS | 1 refills | Status: AC

## 2019-03-19 NOTE — Progress Notes (Signed)
Subjective:     Patient ID: Kaylee Mendoza , female    DOB: 05-18-1961 , 58 y.o.   MRN: [id number]   Chief Complaint  Patient presents with  . Knee Pain    Both    HPI  She feels she needs fluid drained from her knee primarily the right knee.  She has had this done before and the orthopedic at that time wanted to do knee surgery approximately 5 years ago.  She had a torn meniscus at that time too. Denies any falls.  Difficult when changing positions from sitting and standing.  Knee Pain  The incident occurred more than 1 week ago (1 month ago). There was no injury mechanism. The pain is present in the right knee. The quality of the pain is described as aching. The pain has been intermittent since onset. Treatments tried: flexeril  The treatment provided moderate relief.     Past Medical History:  Diagnosis Date  . Hypertension    pt denies-no tx  . Muscle spasm      Family History  Problem Relation Age of Onset  . Diabetes Mother   . Heart disease Mother   . Diabetes Father   . Colon polyps Sister   . Colon cancer Brother 68  . Esophageal cancer Neg Hx   . Rectal cancer Neg Hx   . Stomach cancer Neg Hx      Current Outpatient Medications:  .  cyclobenzaprine (FLEXERIL) 10 MG tablet, Take 1 tablet (10 mg total) by mouth 3 (three) times daily as needed for muscle spasms., Disp: 30 tablet, Rfl: 0 .  Vitamin D, Ergocalciferol, (DRISDOL) 1.25 MG (50000 UT) CAPS capsule, Take 1 capsule (50,000 Units total) by mouth 2 (two) times a week., Disp: 24 capsule, Rfl: 0   No Known Allergies   Review of Systems  Constitutional: Negative.   Respiratory: Negative.   Cardiovascular: Negative.  Negative for chest pain, palpitations and leg swelling.  Musculoskeletal: Positive for arthralgias (right knee more than left).  Neurological: Negative for dizziness and headaches.     Today's Vitals   03/19/19 0832  BP: 128/82  Pulse: 85  Temp: 98.1 F (36.7 C)  TempSrc: Oral   Weight: 221 lb (100.2 kg)  Height: 5\' 7"  (1.702 m)  PainSc: 4   PainLoc: Knee   Body mass index is 34.61 kg/m.   Objective:  Physical Exam Constitutional:      General: She is not in acute distress.    Appearance: Normal appearance.  Cardiovascular:     Rate and Rhythm: Normal rate and regular rhythm.     Pulses: Normal pulses.     Heart sounds: Normal heart sounds. No murmur.  Pulmonary:     Effort: Pulmonary effort is normal. No respiratory distress.     Breath sounds: Normal breath sounds.  Musculoskeletal:        General: Tenderness present.  Skin:    Capillary Refill: Capillary refill takes less than 2 seconds.  Neurological:     General: No focal deficit present.     Mental Status: She is alert and oriented to person, place, and time.         Assessment And Plan:     1. Chronic pain of right knee  Pain worse when changing positions  Previous history of meniscus tear   She would like to see an orthopedic for further evaluation  Negative for ballottment however does have crepitus and pain to medial joint - Ambulatory referral  to Orthopedic Surgery  2. Class 1 obesity due to excess calories without serious comorbidity with body mass index (BMI) of 34.0 to 34.9 in adult Chronic Discussed healthy diet avoiding processed foods and regular exercise options  Encouraged to exercise at least 150 minutes per week with 2 days of strength training I have given her another sample of Saxenda and discussed with her to avoid stopping and starting an exercise regimen as it causes rebound weight gain.   - Liraglutide -Weight Management (SAXENDA) 18 MG/3ML SOPN; Inject 3 mg into the skin daily.  Dispense: 5 pen; Refill: 1       Minette Brine, FNP    THE PATIENT IS ENCOURAGED TO PRACTICE SOCIAL DISTANCING DUE TO THE COVID-19 PANDEMIC.

## 2019-03-25 ENCOUNTER — Telehealth: Payer: Self-pay

## 2019-03-25 NOTE — Telephone Encounter (Signed)
Patient's PA for saxenda has been approved both pharmacy and patient have been notified YRL,RMA

## 2019-05-25 ENCOUNTER — Ambulatory Visit: Payer: BC Managed Care – PPO | Admitting: Nurse Practitioner

## 2019-06-18 ENCOUNTER — Encounter: Payer: Self-pay | Admitting: Nurse Practitioner

## 2019-06-18 ENCOUNTER — Other Ambulatory Visit: Payer: Self-pay

## 2019-06-18 ENCOUNTER — Ambulatory Visit (INDEPENDENT_AMBULATORY_CARE_PROVIDER_SITE_OTHER): Payer: BC Managed Care – PPO | Admitting: Nurse Practitioner

## 2019-06-18 VITALS — BP 124/88 | HR 82 | Temp 97.7°F | Ht 65.0 in | Wt 221.2 lb

## 2019-06-18 DIAGNOSIS — M62838 Other muscle spasm: Secondary | ICD-10-CM | POA: Diagnosis not present

## 2019-06-18 DIAGNOSIS — M5441 Lumbago with sciatica, right side: Secondary | ICD-10-CM | POA: Diagnosis not present

## 2019-06-18 MED ORDER — KETOROLAC TROMETHAMINE 60 MG/2ML IM SOLN: 60.0000 mg | Freq: Once | INTRAMUSCULAR | Status: DC

## 2019-06-18 MED ORDER — TRIAMCINOLONE ACETONIDE 40 MG/ML IJ SUSP
60.0000 mg | Freq: Once | INTRAMUSCULAR | Status: AC
  Administered 2019-06-18: 60 mg via INTRAMUSCULAR

## 2019-06-18 MED ORDER — CYCLOBENZAPRINE HCL 10 MG PO TABS: 10.0000 mg | ORAL_TABLET | Freq: Three times a day (TID) | ORAL | 0 refills | Status: AC | PRN
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
## 2019-06-19 ENCOUNTER — Ambulatory Visit
Admission: RE | Admit: 2019-06-19 | Discharge: 2019-06-19 | Disposition: A | Discharge status: Home / Self Care | Payer: BC Managed Care – PPO | Source: Ambulatory Visit | Attending: Nurse Practitioner | Admitting: Nurse Practitioner

## 2019-06-19 DIAGNOSIS — M5441 Lumbago with sciatica, right side: Secondary | ICD-10-CM

## 2019-06-22 ENCOUNTER — Other Ambulatory Visit: Payer: Self-pay | Admitting: Nurse Practitioner

## 2019-06-22 ENCOUNTER — Ambulatory Visit: Payer: BC Managed Care – PPO | Admitting: Nurse Practitioner

## 2019-06-22 ENCOUNTER — Encounter: Payer: Self-pay | Admitting: Nurse Practitioner

## 2019-06-22 DIAGNOSIS — M5441 Lumbago with sciatica, right side: Secondary | ICD-10-CM

## 2019-06-22 NOTE — Progress Notes (Signed)
She can go back to work with light duty until seen by [employer]

## 2019-07-23 ENCOUNTER — Telehealth: Payer: Self-pay

## 2019-07-23 NOTE — Telephone Encounter (Signed)
Patient's PA for [employer]ke Sh[employer]gy has been denied due to her not losing any weight while on it and I have attempted to call the pt she is due for an appointment for weight check. I left her a v/m to call the office [facility]

## 2019-08-03 ENCOUNTER — Telehealth: Payer: Self-pay

## 2019-08-03 NOTE — Telephone Encounter (Signed)
PA for saxenda has been approved 3-11/21 through 11/29/19 pharmacy made aware and they will notify pt. YL,RMA

## 2019-09-09 ENCOUNTER — Other Ambulatory Visit: Payer: Self-pay

## 2019-09-09 MED ORDER — VITAMIN D (ERGOCALCIFEROL) 1.25 MG (50000 UNIT) PO CAPS: 50000.0000 [IU] | ORAL_CAPSULE | ORAL | 0 refills | Status: AC

## 2019-09-24 ENCOUNTER — Ambulatory Visit: Payer: BC Managed Care – PPO | Attending: Internal Medicine

## 2019-09-24 DIAGNOSIS — Z23 Encounter for immunization: Secondary | ICD-10-CM

## 2019-09-24 NOTE — Progress Notes (Signed)
   Covid-19 Vaccination Clinic  Name:  Kaylee Mendoza    MRN: [id number] DOB: [birth year]  09/24/2019  [name] was observed post Covid-19 immunization for 15 minutes without incident. She was provided with Vaccine Information Sheet and instruction to access the V-Safe system.   [name] was instructed to call 911 with any severe reactions post vaccine: Marland Kitchen Difficulty breathing  . Swelling of face and throat  . A fast heartbeat  . A bad rash all over body  . Dizziness and weakness   Immunizations Administered    Name Date Dose VIS Date Route   Pfizer COVID-19 Vaccine 09/24/2019  8:49 AM 0.3 mL 07/15/2018 Intramuscular   Manufacturer: [place]   Lot: [id number]   [place]: [id number]

## 2019-10-02 ENCOUNTER — Encounter: Payer: BC Managed Care – PPO | Admitting: Nurse Practitioner

## 2019-10-08 ENCOUNTER — Encounter: Payer: BC Managed Care – PPO | Admitting: Nurse Practitioner

## 2019-10-20 ENCOUNTER — Ambulatory Visit: Payer: BC Managed Care – PPO

## 2019-10-26 ENCOUNTER — Ambulatory Visit: Payer: BC Managed Care – PPO | Attending: Internal Medicine

## 2019-10-26 DIAGNOSIS — Z23 Encounter for immunization: Secondary | ICD-10-CM

## 2019-10-26 NOTE — Progress Notes (Signed)
   Covid-19 Vaccination Clinic  Name:  Kaylee Mendoza    MRN: [id number] DOB: December 04, 1960  10/26/2019  [name] was observed post Covid-19 immunization for 15 minutes without incident. She was provided with Vaccine Information Sheet and instruction to access the V-Safe system.   [name] was instructed to call 911 with any severe reactions post vaccine: Marland Kitchen Difficulty breathing  . Swelling of face and throat  . A fast heartbeat  . A bad rash all over body  . Dizziness and weakness   Immunizations Administered    Name Date Dose VIS Date Route   Pfizer COVID-19 Vaccine 10/26/2019  8:21 AM 0.3 mL 07/15/2018 Intramuscular   Manufacturer: [place]   Lot: [id number]   [place]: [id number]

## 2019-12-08 ENCOUNTER — Telehealth: Payer: Self-pay

## 2019-12-08 NOTE — Telephone Encounter (Signed)
lvm for pt to return call for appt. Has not been seen since 06/18/19.

## 2020-01-14 ENCOUNTER — Telehealth: Payer: Self-pay

## 2020-01-14 NOTE — Telephone Encounter (Signed)
Called pt to see if she has a record of last tdap, pt stated she will get information from Employee health and contact us back with info

## 2020-02-25 ENCOUNTER — Ambulatory Visit (HOSPITAL_COMMUNITY)
Admission: EM | Admit: 2020-02-25 | Discharge: 2020-02-25 | Discharge status: Against Medical Advice | Payer: BC Managed Care – PPO

## 2020-02-25 ENCOUNTER — Encounter (HOSPITAL_COMMUNITY): Payer: Self-pay | Admitting: *Deleted

## 2020-02-25 ENCOUNTER — Ambulatory Visit (HOSPITAL_COMMUNITY)
Admission: EM | Admit: 2020-02-25 | Discharge: 2020-02-25 | Disposition: A | Discharge status: Home / Self Care | Payer: BC Managed Care – PPO | Attending: Family Medicine | Admitting: Family Medicine

## 2020-02-25 ENCOUNTER — Other Ambulatory Visit: Payer: Self-pay

## 2020-02-25 DIAGNOSIS — T148XXA Other injury of unspecified body region, initial encounter: Secondary | ICD-10-CM | POA: Diagnosis not present

## 2020-02-25 NOTE — ED Notes (Signed)
Pt called a second time and said she had left and would be right back.

## 2020-02-25 NOTE — ED Triage Notes (Signed)
Patient reports left lower side of abdomen got caught in a zipper last night. The zipper is still attached, patient caught pants off.

## 2020-02-25 NOTE — ED Notes (Signed)
Pt did not answer when staff call in the waiting area. Phine number on chart was called and no one answered and is a business number.

## 2020-02-25 NOTE — Discharge Instructions (Addendum)
Watch for infection Return if any problems develop

## 2020-02-25 NOTE — ED Provider Notes (Signed)
[facility]    CSN: [id number] Arrival date & time: 02/25/20  1841      History   Chief Complaint Chief Complaint  Patient presents with  . Abrasion    HPI Kaylee Mendoza is a 59 y.o. female.   HPI  Patient zipped up her left flank and her zipper yesterday.  Is very painful.  She needs to have this piece of zipper and fabric removed, but is unable to do so because of the pain Tetanus is up-to-date Past Medical History:  Diagnosis Date  . Hypertension    pt denies-no tx  . Muscle spasm     Patient Active Problem List   Diagnosis Date Noted  . Obesity (BMI 35.0-39.9 without comorbidity) 10/01/2018  . Essential hypertension 10/01/2018  . Chronic bilateral low back pain without sciatica 07/29/2018  . Muscle spasm 07/29/2018  . Shortness of breath 07/29/2018  . Blood pressure elevated without history of HTN 07/07/2018  . Prediabetes 07/07/2018    Past Surgical History:  Procedure Laterality Date  . LEEP      OB History   No obstetric history on file.      Home Medications    Prior to Admission medications   Medication Sig Start Date End Date Taking? Authorizing Provider  cyclobenzaprine (FLEXERIL) 10 MG tablet Take 1 tablet (10 mg total) by mouth 3 (three) times daily as needed for muscle spasms. 06/18/19   Minette Brine, FNP  Liraglutide -Weight Management (SAXENDA) 18 MG/3ML SOPN Inject 3 mg into the skin daily. 03/19/19   Minette Brine, FNP  Vitamin D, Ergocalciferol, (DRISDOL) 1.25 MG (50000 UNIT) CAPS capsule Take 1 capsule (50,000 Units total) by mouth 2 (two) times a week. 09/10/19   Minette Brine, FNP    Family History Family History  Problem Relation Age of Onset  . Diabetes Mother   . Heart disease Mother   . Diabetes Father   . Colon polyps Sister   . Colon cancer Brother 15  . Esophageal cancer Neg Hx   . Rectal cancer Neg Hx   . Stomach cancer Neg Hx     Social History Social History   Tobacco Use  . Smoking status: Never  Smoker  . Smokeless tobacco: Never Used  Vaping Use  . Vaping Use: Never used  Substance Use Topics  . Alcohol use: Yes    Comment: wine occ per pt-once a month per pt  . Drug use: No     Allergies   Ibuprofen   Review of Systems Review of Systems See HPI  Physical Exam Triage Vital Signs ED Triage Vitals  Enc Vitals Group     BP 02/25/20 2001 (!) 174/93     Pulse Rate 02/25/20 2001 72     Resp 02/25/20 2001 16     Temp 02/25/20 2001 98 F (36.7 C)     Temp Source 02/25/20 2001 Oral     SpO2 02/25/20 2001 100 %     Weight --      Height --      Head Circumference --      Peak Flow --      Pain Score 02/25/20 1959 5     Pain Loc --      Pain Edu? --      Excl. in [place]? --    No data found.  Updated Vital Signs BP (!) 174/93 (BP Location: Right Arm)   Pulse 72   Temp 98 F (36.7 C) (Oral)  Resp 16   SpO2 100%      Physical Exam Constitutional:      General: She is not in acute distress.    Appearance: She is well-developed.  HENT:     Head: Normocephalic and atraumatic.  Eyes:     Conjunctiva/sclera: Conjunctivae normal.     Pupils: Pupils are equal, round, and reactive to light.  Cardiovascular:     Rate and Rhythm: Normal rate.  Pulmonary:     Effort: Pulmonary effort is normal. No respiratory distress.  Abdominal:     Palpations: Abdomen is soft.     Comments: Protuberant abdomen.  I left flank in the left lower quadrant of the abdomen, towards the side there is a piece of fabric attached where she has cut her pants around the zipper.  Its about 4 inches x 4 inches.  As I lift this off is very painful but I can see that it is attached to her skin at the site is upper.  I anesthetized this with 1% lidocaine and then was unable to remove it.  There is a small eschar remaining but no infection or bleeding  Musculoskeletal:        General: Normal range of motion.     Cervical back: Normal range of motion.  Skin:    General: Skin is warm and dry.    Neurological:     Mental Status: She is alert.  Psychiatric:        Behavior: Behavior normal.      UC Treatments / Results  Labs (all labs ordered are listed, but only abnormal results are displayed) Labs Reviewed - No data to display  EKG   Radiology No results found.  Procedures Procedures (including critical care time)  Medications Ordered in UC Medications - No data to display  Initial Impression / Assessment and Plan / UC Course  I have reviewed the triage vital signs and the nursing notes.  Pertinent labs & imaging results that were available during my care of the patient were reviewed by me and considered in my medical decision making (see chart for details).      Final Clinical Impressions(s) / UC Diagnoses   Final diagnoses:  Abrasion of skin     Discharge Instructions     Watch for infection Return if any problems develop   ED Prescriptions    None     PDMP not reviewed this encounter.   Raylene Everts, MD 02/25/20 2022

## 2020-12-15 ENCOUNTER — Other Ambulatory Visit: Payer: Self-pay | Admitting: Nurse Practitioner

## 2020-12-15 DIAGNOSIS — Z1231 Encounter for screening mammogram for malignant neoplasm of breast: Secondary | ICD-10-CM

## 2020-12-16 ENCOUNTER — Ambulatory Visit
Admission: RE | Admit: 2020-12-16 | Discharge: 2020-12-16 | Disposition: A | Discharge status: Home / Self Care | Payer: BC Managed Care – PPO | Source: Ambulatory Visit | Attending: Nurse Practitioner | Admitting: Nurse Practitioner

## 2020-12-16 ENCOUNTER — Other Ambulatory Visit: Payer: Self-pay

## 2020-12-16 DIAGNOSIS — Z1231 Encounter for screening mammogram for malignant neoplasm of breast: Secondary | ICD-10-CM

## 2021-03-06 ENCOUNTER — Ambulatory Visit: Payer: BC Managed Care – PPO | Admitting: Nurse Practitioner

## 2022-09-23 IMAGING — MG MM DIGITAL SCREENING BILAT W/ TOMO AND CAD
8 series · 8 of 24 positions shown · non-contrast
Comparison: Previous exam(s).

CLINICAL DATA: Screening.

EXAM:
DIGITAL SCREENING BILATERAL MAMMOGRAM WITH TOMOSYNTHESIS AND CAD
TECHNIQUE: Bilateral screening digital craniocaudal and mediolateral oblique
mammograms were obtained. Bilateral screening digital breast
tomosynthesis was performed. The images were evaluated with
computer-aided detection.

[R MLO synth-2D]
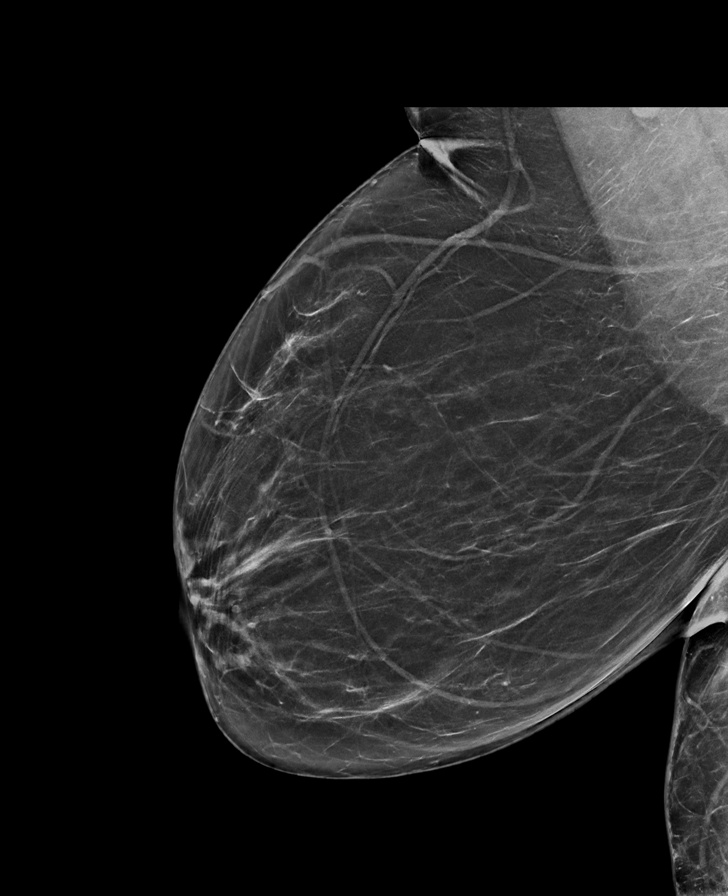

[R CC synth-2D]
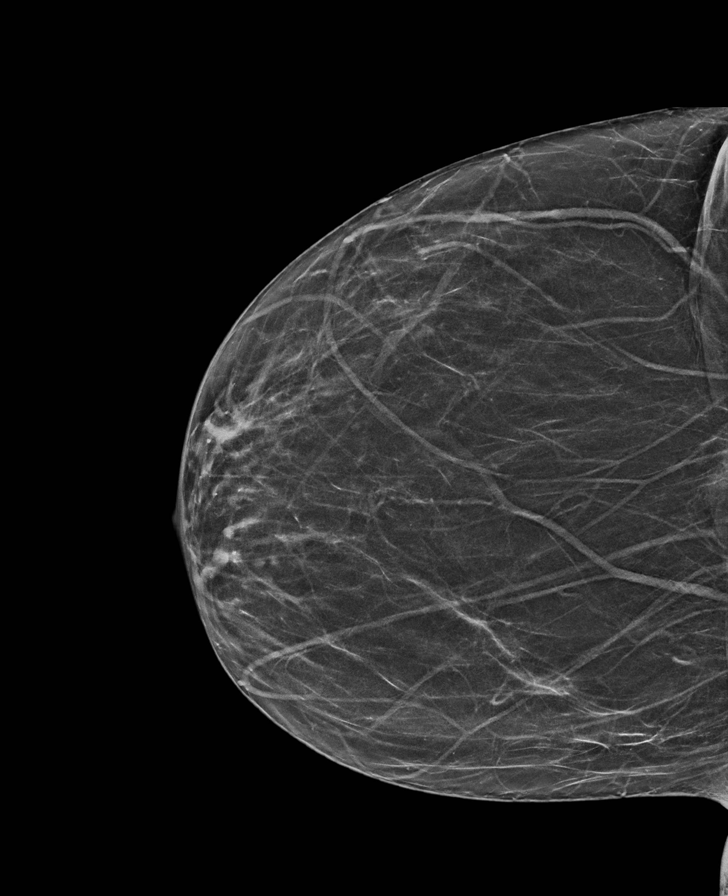

[L MLO synth-2D]
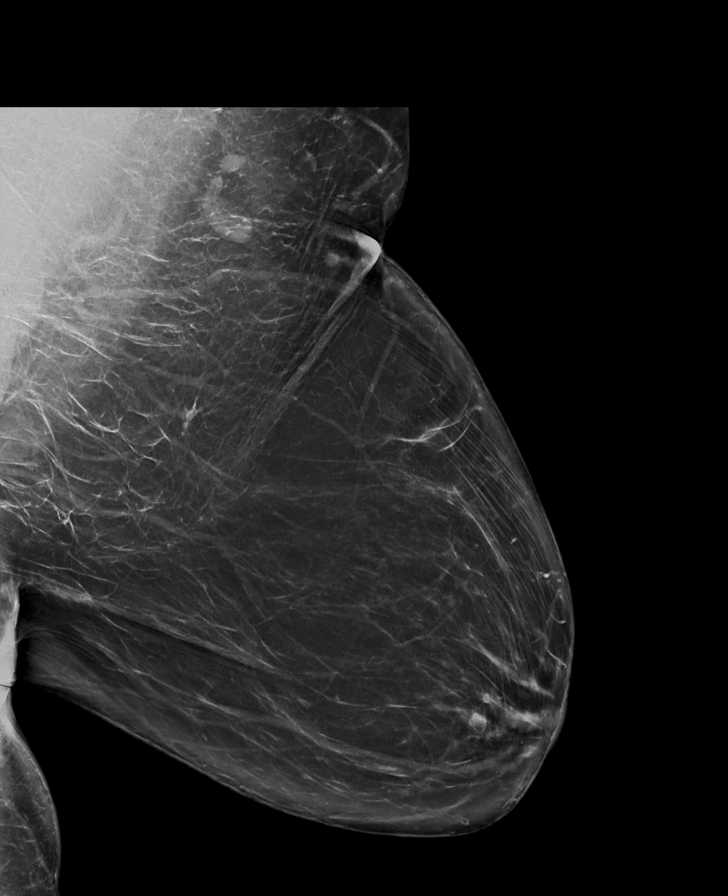

[L CC synth-2D]
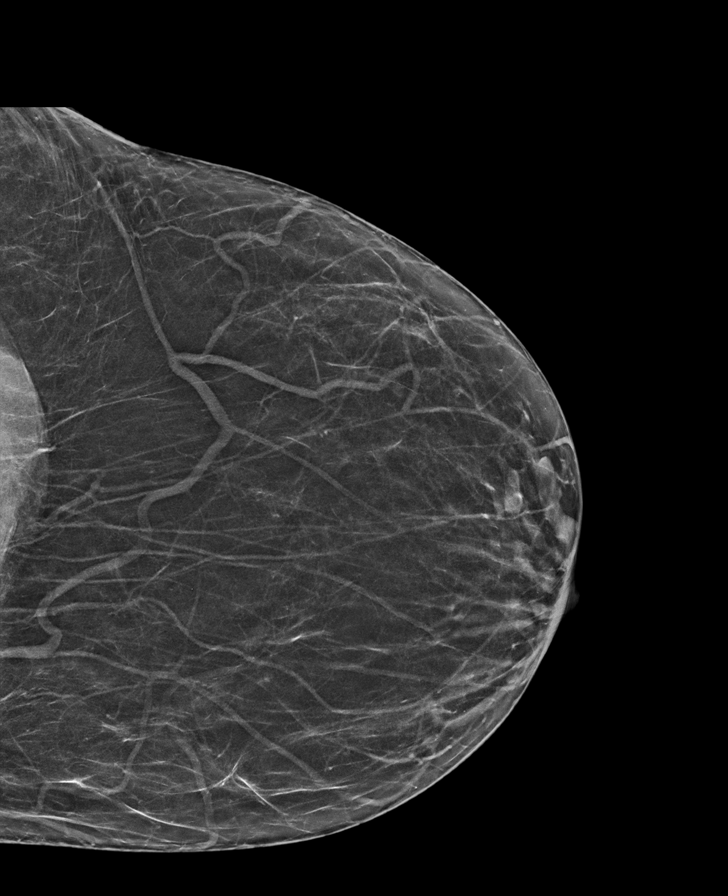

[R CC tomo · tomo slice 29/58.0]
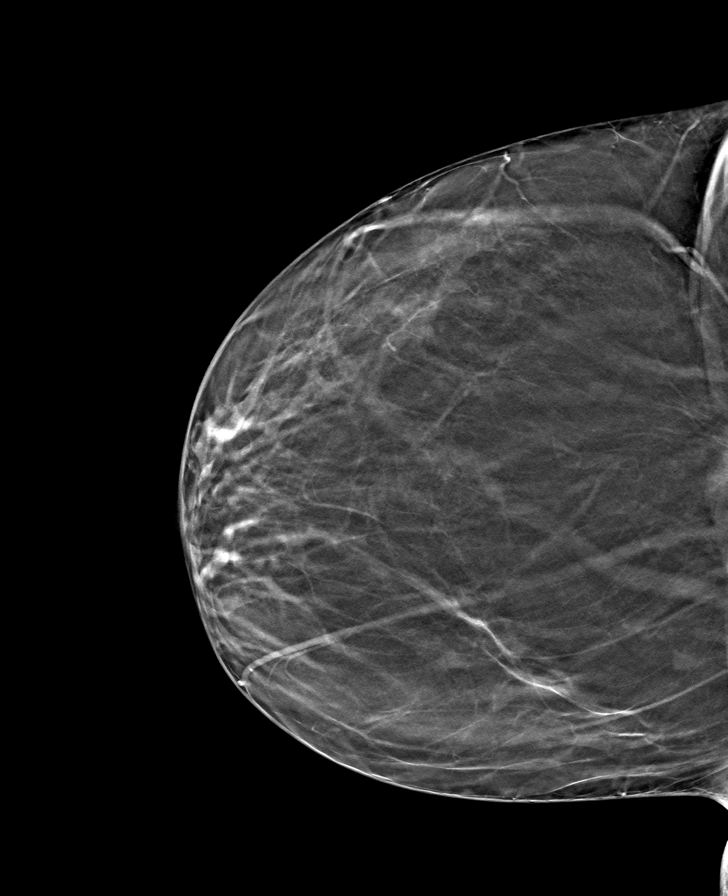

[R MLO tomo · tomo slice 37/73.0]
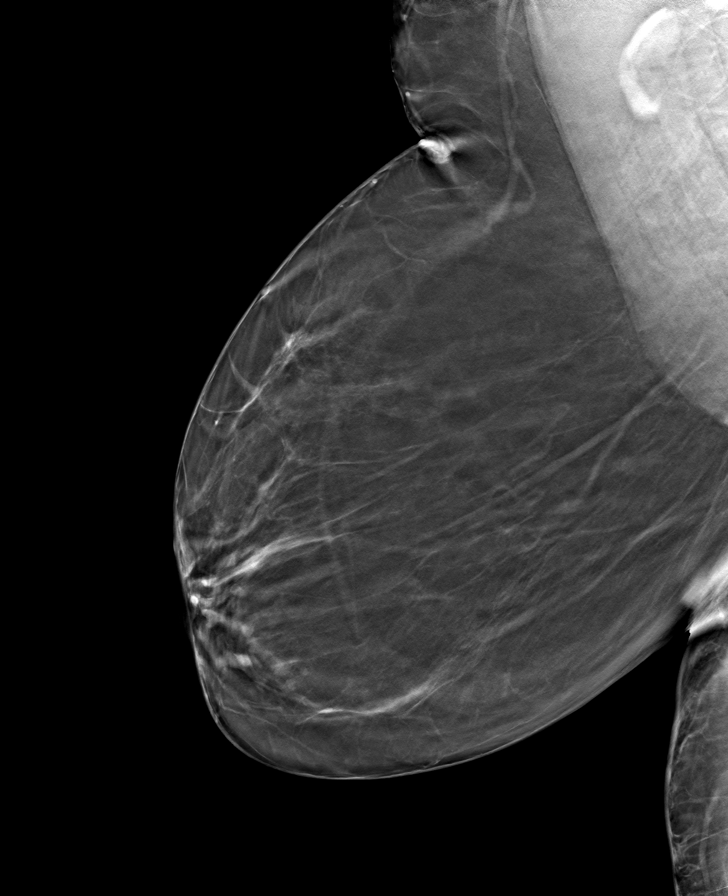

[L CC tomo · tomo slice 30/59.0]
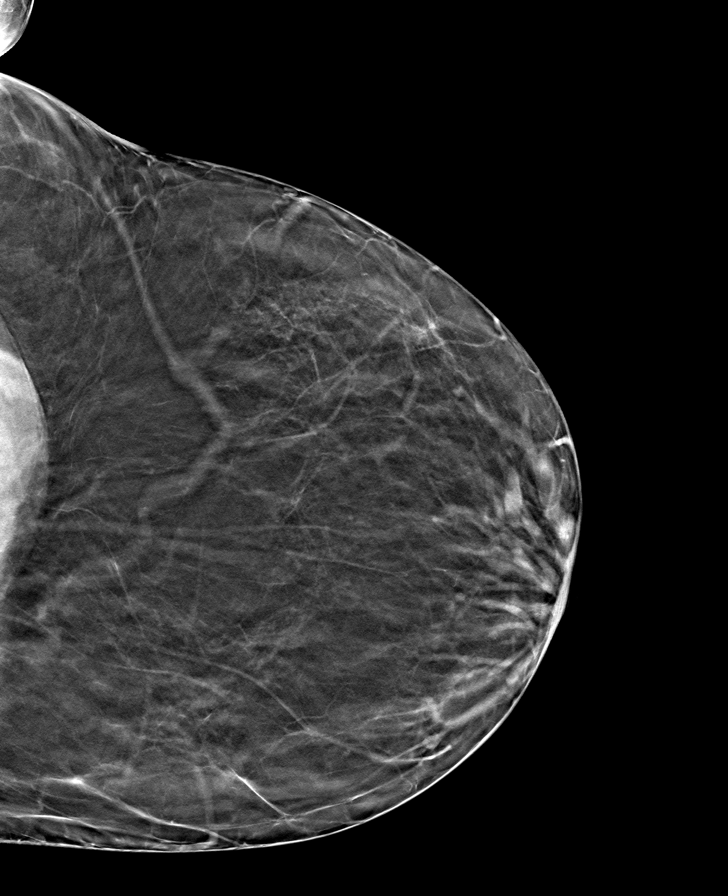

[L MLO tomo · tomo slice 45/88.0]
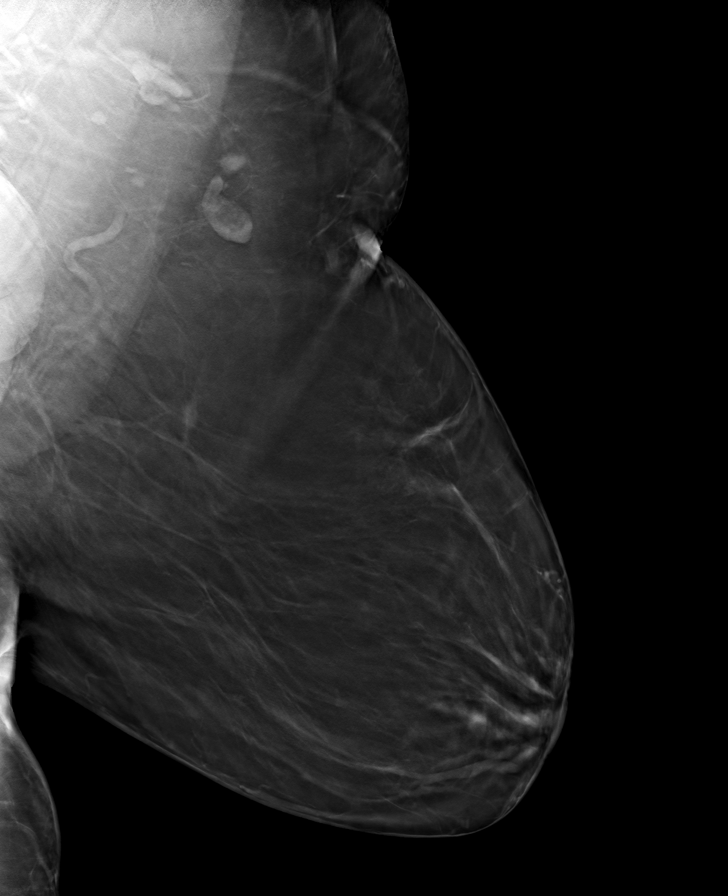

[8 of 24 positions shown; findings below may reference images not displayed]

ACR Breast Density Category b: There are scattered areas of
fibroglandular density.
FINDINGS: There are no findings suspicious for malignancy.
IMPRESSION: No mammographic evidence of malignancy. A result letter of this
screening mammogram will be mailed directly to the patient.

RECOMMENDATION:
Screening mammogram in one year. (Code:51-O-LD2)

BI-RADS CATEGORY  1: Negative.
